# Patient Record
Sex: Male | Born: 1955 | Race: White | Hispanic: No | Marital: Single | Smoking: Former smoker
Health system: Southern US, Community
[De-identification: ages and names within clinical notes are randomized; demographics above are authoritative.]

## PROBLEM LIST (undated history)

## (undated) DIAGNOSIS — I2510 Atherosclerotic heart disease of native coronary artery without angina pectoris: Secondary | ICD-10-CM

## (undated) DIAGNOSIS — I10 Essential (primary) hypertension: Secondary | ICD-10-CM

## (undated) DIAGNOSIS — E785 Hyperlipidemia, unspecified: Secondary | ICD-10-CM

## (undated) HISTORY — DX: Hyperlipidemia, unspecified: E78.5

## (undated) HISTORY — DX: Atherosclerotic heart disease of native coronary artery without angina pectoris: I25.10

## (undated) HISTORY — DX: Essential (primary) hypertension: I10

## (undated) HISTORY — PX: CARDIAC SURGERY: SHX584

---

## 2003-04-23 ENCOUNTER — Emergency Department (HOSPITAL_COMMUNITY): Admission: EM | Admit: 2003-04-23 | Discharge: 2003-04-23 | Payer: Self-pay | Admitting: Emergency Medicine

## 2006-10-01 ENCOUNTER — Encounter: Admission: RE | Admit: 2006-10-01 | Discharge: 2006-10-01 | Payer: Self-pay | Admitting: Family Medicine

## 2006-12-24 ENCOUNTER — Encounter: Admission: RE | Admit: 2006-12-24 | Discharge: 2006-12-24 | Payer: Self-pay | Admitting: Gastroenterology

## 2007-02-11 ENCOUNTER — Ambulatory Visit (HOSPITAL_COMMUNITY): Admission: RE | Admit: 2007-02-11 | Discharge: 2007-02-11 | Payer: Self-pay | Admitting: Gastroenterology

## 2007-02-11 ENCOUNTER — Encounter (INDEPENDENT_AMBULATORY_CARE_PROVIDER_SITE_OTHER): Payer: Self-pay | Admitting: Interventional Radiology

## 2009-10-14 ENCOUNTER — Emergency Department (HOSPITAL_COMMUNITY): Admission: EM | Admit: 2009-10-14 | Discharge: 2009-10-15 | Payer: Self-pay | Admitting: Emergency Medicine

## 2009-10-17 ENCOUNTER — Observation Stay (HOSPITAL_COMMUNITY): Admission: RE | Admit: 2009-10-17 | Discharge: 2009-10-18 | Payer: Self-pay | Admitting: Orthopedic Surgery

## 2010-06-16 LAB — DIFFERENTIAL
Basophils Absolute: 0 10*3/uL (ref 0.0–0.1)
Basophils Relative: 0 % (ref 0–1)
Eosinophils Absolute: 0.1 10*3/uL (ref 0.0–0.7)
Eosinophils Relative: 2 % (ref 0–5)
Lymphocytes Relative: 37 % (ref 12–46)
Lymphs Abs: 2.4 10*3/uL (ref 0.7–4.0)
Monocytes Absolute: 0.5 10*3/uL (ref 0.1–1.0)
Monocytes Relative: 8 % (ref 3–12)
Neutro Abs: 3.4 10*3/uL (ref 1.7–7.7)
Neutrophils Relative %: 53 % (ref 43–77)

## 2010-06-16 LAB — BASIC METABOLIC PANEL
BUN: 10 mg/dL (ref 6–23)
CO2: 27 mEq/L (ref 19–32)
Calcium: 9.5 mg/dL (ref 8.4–10.5)
Chloride: 106 mEq/L (ref 96–112)
Creatinine, Ser: 0.96 mg/dL (ref 0.4–1.5)
GFR calc Af Amer: >60 mL/min (ref >60)
GFR calc non Af Amer: >60 mL/min (ref >60)
Glucose, Bld: 99 mg/dL (ref 70–99)
Potassium: 4.5 mEq/L (ref 3.5–5.1)
Sodium: 139 mEq/L (ref 135–145)

## 2010-06-16 LAB — CBC
HCT: 46.5 % (ref 39.0–52.0)
Hemoglobin: 16.1 g/dL (ref 13.0–17.0)
MCH: 32.9 pg (ref 26.0–34.0)
MCHC: 34.6 g/dL (ref 30.0–36.0)
MCV: 95.1 fL (ref 78.0–100.0)
Platelets: 188 10*3/uL (ref 150–400)
RBC: 4.89 MIL/uL (ref 4.22–5.81)
RDW: 13.5 % (ref 11.5–15.5)
WBC: 6.5 10*3/uL (ref 4.0–10.5)

## 2010-06-16 LAB — SURGICAL PCR SCREEN
MRSA, PCR: NEGATIVE
Staphylococcus aureus: POSITIVE — AB

## 2010-06-16 LAB — PROTIME-INR
INR: 0.87 (ref 0.00–1.49)
Prothrombin Time: 11.8 seconds (ref 11.6–15.2)

## 2010-08-20 ENCOUNTER — Inpatient Hospital Stay (HOSPITAL_COMMUNITY)
Admission: RE | Admit: 2010-08-20 | Discharge: 2010-08-25 | DRG: 234 | Disposition: A | Discharge status: Home / Self Care | Payer: 59 | Source: Ambulatory Visit | Attending: Cardiothoracic Surgery | Admitting: Cardiothoracic Surgery

## 2010-08-20 ENCOUNTER — Ambulatory Visit (HOSPITAL_COMMUNITY): Payer: 59

## 2010-08-20 DIAGNOSIS — K7689 Other specified diseases of liver: Secondary | ICD-10-CM | POA: Diagnosis present

## 2010-08-20 DIAGNOSIS — D6959 Other secondary thrombocytopenia: Secondary | ICD-10-CM | POA: Diagnosis not present

## 2010-08-20 DIAGNOSIS — Z148 Genetic carrier of other disease: Secondary | ICD-10-CM

## 2010-08-20 DIAGNOSIS — E8779 Other fluid overload: Secondary | ICD-10-CM | POA: Diagnosis not present

## 2010-08-20 DIAGNOSIS — I2510 Atherosclerotic heart disease of native coronary artery without angina pectoris: Secondary | ICD-10-CM

## 2010-08-20 DIAGNOSIS — Z01810 Encounter for preprocedural cardiovascular examination: Secondary | ICD-10-CM

## 2010-08-20 DIAGNOSIS — E785 Hyperlipidemia, unspecified: Secondary | ICD-10-CM | POA: Diagnosis present

## 2010-08-20 DIAGNOSIS — I10 Essential (primary) hypertension: Secondary | ICD-10-CM | POA: Diagnosis present

## 2010-08-20 DIAGNOSIS — D62 Acute posthemorrhagic anemia: Secondary | ICD-10-CM | POA: Diagnosis not present

## 2010-08-20 DIAGNOSIS — E669 Obesity, unspecified: Secondary | ICD-10-CM | POA: Diagnosis present

## 2010-08-20 DIAGNOSIS — Z7982 Long term (current) use of aspirin: Secondary | ICD-10-CM

## 2010-08-20 LAB — COMPREHENSIVE METABOLIC PANEL
ALT: 79 U/L — ABNORMAL HIGH (ref 0–53)
AST: 49 U/L — ABNORMAL HIGH (ref 0–37)
Albumin: 3.6 g/dL (ref 3.5–5.2)
Alkaline Phosphatase: 67 U/L (ref 39–117)
BUN: 11 mg/dL (ref 6–23)
CO2: 28 mEq/L (ref 19–32)
Calcium: 9.3 mg/dL (ref 8.4–10.5)
Chloride: 105 mEq/L (ref 96–112)
Creatinine, Ser: 0.94 mg/dL (ref 0.4–1.5)
GFR calc Af Amer: >60 mL/min (ref >60)
GFR calc non Af Amer: >60 mL/min (ref >60)
Glucose, Bld: 174 mg/dL — ABNORMAL HIGH (ref 70–99)
Potassium: 3.6 mEq/L (ref 3.5–5.1)
Sodium: 141 mEq/L (ref 135–145)
Total Bilirubin: 0.4 mg/dL (ref 0.3–1.2)
Total Protein: 6.4 g/dL (ref 6.0–8.3)

## 2010-08-20 LAB — DIFFERENTIAL
Basophils Absolute: 0 10*3/uL (ref 0.0–0.1)
Basophils Relative: 1 % (ref 0–1)
Eosinophils Absolute: 0.1 10*3/uL (ref 0.0–0.7)
Eosinophils Relative: 2 % (ref 0–5)
Lymphocytes Relative: 38 % (ref 12–46)
Lymphs Abs: 2.2 10*3/uL (ref 0.7–4.0)
Monocytes Absolute: 0.4 10*3/uL (ref 0.1–1.0)
Monocytes Relative: 6 % (ref 3–12)
Neutro Abs: 3.1 10*3/uL (ref 1.7–7.7)
Neutrophils Relative %: 54 % (ref 43–77)

## 2010-08-20 LAB — URINALYSIS, ROUTINE W REFLEX MICROSCOPIC
Bilirubin Urine: NEGATIVE
Glucose, UA: 250 mg/dL — AB
Hgb urine dipstick: NEGATIVE
Ketones, ur: NEGATIVE mg/dL
Nitrite: NEGATIVE
Protein, ur: NEGATIVE mg/dL
Specific Gravity, Urine: 1.038 — ABNORMAL HIGH (ref 1.005–1.030)
Urobilinogen, UA: 1 mg/dL (ref 0.0–1.0)
pH: 6.5 (ref 5.0–8.0)

## 2010-08-20 LAB — BLOOD GAS, ARTERIAL
Acid-base deficit: 1.3 mmol/L (ref 0.0–2.0)
Bicarbonate: 22.5 mEq/L (ref 20.0–24.0)
Drawn by: 312761
FIO2: 0.21 %
O2 Saturation: 97.6 %
Patient temperature: 98.6
TCO2: 23.5 mmol/L (ref 0–100)
pCO2 arterial: 35.1 mmHg (ref 35.0–45.0)
pH, Arterial: 7.422 (ref 7.350–7.450)
pO2, Arterial: 92.5 mmHg (ref 80.0–100.0)

## 2010-08-20 LAB — CBC
HCT: 45.3 % (ref 39.0–52.0)
Hemoglobin: 16.1 g/dL (ref 13.0–17.0)
MCH: 32.9 pg (ref 26.0–34.0)
MCHC: 35.5 g/dL (ref 30.0–36.0)
MCV: 92.4 fL (ref 78.0–100.0)
Platelets: 166 10*3/uL (ref 150–400)
RBC: 4.9 MIL/uL (ref 4.22–5.81)
RDW: 12.9 % (ref 11.5–15.5)
WBC: 5.8 10*3/uL (ref 4.0–10.5)

## 2010-08-20 LAB — PROTIME-INR
INR: 0.93 (ref 0.00–1.49)
Prothrombin Time: 12.7 seconds (ref 11.6–15.2)

## 2010-08-20 LAB — TYPE AND SCREEN
ABO/RH(D): A POS
Antibody Screen: NEGATIVE

## 2010-08-20 LAB — SURGICAL PCR SCREEN
MRSA, PCR: NEGATIVE
Staphylococcus aureus: POSITIVE — AB

## 2010-08-20 LAB — APTT: aPTT: 27 seconds (ref 24–37)

## 2010-08-20 LAB — ABO/RH: ABO/RH(D): A POS

## 2010-08-21 ENCOUNTER — Inpatient Hospital Stay (HOSPITAL_COMMUNITY): Payer: 59

## 2010-08-21 DIAGNOSIS — I2510 Atherosclerotic heart disease of native coronary artery without angina pectoris: Secondary | ICD-10-CM

## 2010-08-21 LAB — POCT I-STAT 3, ART BLOOD GAS (G3+)
Acid-base deficit: 2 mmol/L (ref 0.0–2.0)
Acid-base deficit: 3 mmol/L — ABNORMAL HIGH (ref 0.0–2.0)
Acid-base deficit: 3 mmol/L — ABNORMAL HIGH (ref 0.0–2.0)
Bicarbonate: 25.8 mEq/L — ABNORMAL HIGH (ref 20.0–24.0)
Bicarbonate: 23.9 mEq/L (ref 20.0–24.0)
Bicarbonate: 23.3 mEq/L (ref 20.0–24.0)
Bicarbonate: 22.8 mEq/L (ref 20.0–24.0)
O2 Saturation: 100 %
O2 Saturation: 96 %
O2 Saturation: 93 %
O2 Saturation: 92 %
Patient temperature: 36.2
Patient temperature: 36.9
TCO2: 27 mmol/L (ref 0–100)
TCO2: 25 mmol/L (ref 0–100)
TCO2: 25 mmol/L (ref 0–100)
TCO2: 24 mmol/L (ref 0–100)
pCO2 arterial: 46.6 mmHg — ABNORMAL HIGH (ref 35.0–45.0)
pCO2 arterial: 42.4 mmHg (ref 35.0–45.0)
pCO2 arterial: 44.9 mmHg (ref 35.0–45.0)
pCO2 arterial: 40.7 mmHg (ref 35.0–45.0)
pH, Arterial: 7.351 (ref 7.350–7.450)
pH, Arterial: 7.359 (ref 7.350–7.450)
pH, Arterial: 7.318 — ABNORMAL LOW (ref 7.350–7.450)
pH, Arterial: 7.356 (ref 7.350–7.450)
pO2, Arterial: 279 mmHg — ABNORMAL HIGH (ref 80.0–100.0)
pO2, Arterial: 85 mmHg (ref 80.0–100.0)
pO2, Arterial: 70 mmHg — ABNORMAL LOW (ref 80.0–100.0)
pO2, Arterial: 68 mmHg — ABNORMAL LOW (ref 80.0–100.0)

## 2010-08-21 LAB — POCT I-STAT, CHEM 8
BUN: 8 mg/dL (ref 6–23)
Calcium, Ion: 1.02 mmol/L — ABNORMAL LOW (ref 1.12–1.32)
Chloride: 105 mEq/L (ref 96–112)
Creatinine, Ser: 0.8 mg/dL (ref 0.4–1.5)
Glucose, Bld: 146 mg/dL — ABNORMAL HIGH (ref 70–99)
HCT: 32 % — ABNORMAL LOW (ref 39.0–52.0)
Hemoglobin: 10.9 g/dL — ABNORMAL LOW (ref 13.0–17.0)
Potassium: 3.7 mEq/L (ref 3.5–5.1)
Sodium: 138 mEq/L (ref 135–145)
TCO2: 22 mmol/L (ref 0–100)

## 2010-08-21 LAB — CBC
HCT: 48.4 % (ref 39.0–52.0)
HCT: 32.7 % — ABNORMAL LOW (ref 39.0–52.0)
HCT: 33.6 % — ABNORMAL LOW (ref 39.0–52.0)
Hemoglobin: 17.3 g/dL — ABNORMAL HIGH (ref 13.0–17.0)
Hemoglobin: 11.6 g/dL — ABNORMAL LOW (ref 13.0–17.0)
Hemoglobin: 11.8 g/dL — ABNORMAL LOW (ref 13.0–17.0)
MCH: 32.8 pg (ref 26.0–34.0)
MCH: 32.1 pg (ref 26.0–34.0)
MCH: 31.8 pg (ref 26.0–34.0)
MCHC: 35.7 g/dL (ref 30.0–36.0)
MCHC: 35.5 g/dL (ref 30.0–36.0)
MCHC: 35.1 g/dL (ref 30.0–36.0)
MCV: 91.8 fL (ref 78.0–100.0)
MCV: 90.6 fL (ref 78.0–100.0)
MCV: 90.6 fL (ref 78.0–100.0)
Platelets: 197 10*3/uL (ref 150–400)
Platelets: 123 10*3/uL — ABNORMAL LOW (ref 150–400)
Platelets: 119 10*3/uL — ABNORMAL LOW (ref 150–400)
RBC: 5.27 MIL/uL (ref 4.22–5.81)
RBC: 3.61 MIL/uL — ABNORMAL LOW (ref 4.22–5.81)
RBC: 3.71 MIL/uL — ABNORMAL LOW (ref 4.22–5.81)
RDW: 12.8 % (ref 11.5–15.5)
RDW: 12.5 % (ref 11.5–15.5)
RDW: 12.7 % (ref 11.5–15.5)
WBC: 8.1 10*3/uL (ref 4.0–10.5)
WBC: 14.1 10*3/uL — ABNORMAL HIGH (ref 4.0–10.5)
WBC: 14.6 10*3/uL — ABNORMAL HIGH (ref 4.0–10.5)

## 2010-08-21 LAB — POCT I-STAT 4, (NA,K, GLUC, HGB,HCT)
Glucose, Bld: 112 mg/dL — ABNORMAL HIGH (ref 70–99)
Glucose, Bld: 108 mg/dL — ABNORMAL HIGH (ref 70–99)
Glucose, Bld: 97 mg/dL (ref 70–99)
Glucose, Bld: 116 mg/dL — ABNORMAL HIGH (ref 70–99)
Glucose, Bld: 113 mg/dL — ABNORMAL HIGH (ref 70–99)
Glucose, Bld: 110 mg/dL — ABNORMAL HIGH (ref 70–99)
HCT: 45 % (ref 39.0–52.0)
HCT: 41 % (ref 39.0–52.0)
HCT: 30 % — ABNORMAL LOW (ref 39.0–52.0)
HCT: 27 % — ABNORMAL LOW (ref 39.0–52.0)
HCT: 31 % — ABNORMAL LOW (ref 39.0–52.0)
HCT: 32 % — ABNORMAL LOW (ref 39.0–52.0)
Hemoglobin: 15.3 g/dL (ref 13.0–17.0)
Hemoglobin: 13.9 g/dL (ref 13.0–17.0)
Hemoglobin: 10.2 g/dL — ABNORMAL LOW (ref 13.0–17.0)
Hemoglobin: 9.2 g/dL — ABNORMAL LOW (ref 13.0–17.0)
Hemoglobin: 10.5 g/dL — ABNORMAL LOW (ref 13.0–17.0)
Hemoglobin: 10.9 g/dL — ABNORMAL LOW (ref 13.0–17.0)
Potassium: 4.3 mEq/L (ref 3.5–5.1)
Potassium: 4.6 mEq/L (ref 3.5–5.1)
Potassium: 4.6 mEq/L (ref 3.5–5.1)
Potassium: 5.7 mEq/L — ABNORMAL HIGH (ref 3.5–5.1)
Potassium: 4.2 mEq/L (ref 3.5–5.1)
Potassium: 4 mEq/L (ref 3.5–5.1)
Sodium: 140 mEq/L (ref 135–145)
Sodium: 140 mEq/L (ref 135–145)
Sodium: 135 mEq/L (ref 135–145)
Sodium: 136 mEq/L (ref 135–145)
Sodium: 141 mEq/L (ref 135–145)
Sodium: 141 mEq/L (ref 135–145)

## 2010-08-21 LAB — PROTIME-INR
INR: 1.47 (ref 0.00–1.49)
Prothrombin Time: 18 seconds — ABNORMAL HIGH (ref 11.6–15.2)

## 2010-08-21 LAB — HEMOGLOBIN AND HEMATOCRIT, BLOOD
HCT: 27.9 % — ABNORMAL LOW (ref 39.0–52.0)
Hemoglobin: 9.9 g/dL — ABNORMAL LOW (ref 13.0–17.0)

## 2010-08-21 LAB — MAGNESIUM: Magnesium: 2.3 mg/dL (ref 1.5–2.5)

## 2010-08-21 LAB — GLUCOSE, CAPILLARY: Glucose-Capillary: 125 mg/dL — ABNORMAL HIGH (ref 70–99)

## 2010-08-21 LAB — CREATININE, SERUM
Creatinine, Ser: 0.62 mg/dL (ref 0.4–1.5)
GFR calc Af Amer: >60 mL/min (ref >60)
GFR calc non Af Amer: >60 mL/min (ref >60)

## 2010-08-21 LAB — HEMOGLOBIN A1C
Hgb A1c MFr Bld: 5.8 % — ABNORMAL HIGH (ref <5.7)
Mean Plasma Glucose: 120 mg/dL — ABNORMAL HIGH (ref <117)

## 2010-08-21 LAB — APTT: aPTT: 31 seconds (ref 24–37)

## 2010-08-21 LAB — PLATELET COUNT: Platelets: 103 10*3/uL — ABNORMAL LOW (ref 150–400)

## 2010-08-22 ENCOUNTER — Inpatient Hospital Stay (HOSPITAL_COMMUNITY): Payer: 59

## 2010-08-22 DIAGNOSIS — E1165 Type 2 diabetes mellitus with hyperglycemia: Secondary | ICD-10-CM

## 2010-08-22 DIAGNOSIS — IMO0001 Reserved for inherently not codable concepts without codable children: Secondary | ICD-10-CM

## 2010-08-22 LAB — BASIC METABOLIC PANEL
BUN: 11 mg/dL (ref 6–23)
CO2: 24 mEq/L (ref 19–32)
Calcium: 6.8 mg/dL — ABNORMAL LOW (ref 8.4–10.5)
Chloride: 107 mEq/L (ref 96–112)
Creatinine, Ser: 0.69 mg/dL (ref 0.4–1.5)
GFR calc Af Amer: >60 mL/min (ref >60)
GFR calc non Af Amer: >60 mL/min (ref >60)
Glucose, Bld: 145 mg/dL — ABNORMAL HIGH (ref 70–99)
Potassium: 3.9 mEq/L (ref 3.5–5.1)
Sodium: 138 mEq/L (ref 135–145)

## 2010-08-22 LAB — GLUCOSE, CAPILLARY
Glucose-Capillary: 106 mg/dL — ABNORMAL HIGH (ref 70–99)
Glucose-Capillary: 126 mg/dL — ABNORMAL HIGH (ref 70–99)
Glucose-Capillary: 136 mg/dL — ABNORMAL HIGH (ref 70–99)
Glucose-Capillary: 140 mg/dL — ABNORMAL HIGH (ref 70–99)
Glucose-Capillary: 150 mg/dL — ABNORMAL HIGH (ref 70–99)
Glucose-Capillary: 152 mg/dL — ABNORMAL HIGH (ref 70–99)
Glucose-Capillary: 156 mg/dL — ABNORMAL HIGH (ref 70–99)
Glucose-Capillary: 158 mg/dL — ABNORMAL HIGH (ref 70–99)
Glucose-Capillary: 164 mg/dL — ABNORMAL HIGH (ref 70–99)
Glucose-Capillary: 84 mg/dL (ref 70–99)

## 2010-08-22 LAB — CREATININE, SERUM
Creatinine, Ser: 0.97 mg/dL (ref 0.4–1.5)
GFR calc Af Amer: >60 mL/min (ref >60)
GFR calc non Af Amer: >60 mL/min (ref >60)

## 2010-08-22 LAB — CBC
HCT: 31.5 % — ABNORMAL LOW (ref 39.0–52.0)
HCT: 31.4 % — ABNORMAL LOW (ref 39.0–52.0)
Hemoglobin: 11.1 g/dL — ABNORMAL LOW (ref 13.0–17.0)
Hemoglobin: 11.1 g/dL — ABNORMAL LOW (ref 13.0–17.0)
MCH: 31.7 pg (ref 26.0–34.0)
MCH: 32.6 pg (ref 26.0–34.0)
MCHC: 35.2 g/dL (ref 30.0–36.0)
MCHC: 35.4 g/dL (ref 30.0–36.0)
MCV: 90 fL (ref 78.0–100.0)
MCV: 92.4 fL (ref 78.0–100.0)
Platelets: 118 10*3/uL — ABNORMAL LOW (ref 150–400)
Platelets: 105 10*3/uL — ABNORMAL LOW (ref 150–400)
RBC: 3.5 MIL/uL — ABNORMAL LOW (ref 4.22–5.81)
RBC: 3.4 MIL/uL — ABNORMAL LOW (ref 4.22–5.81)
RDW: 12.8 % (ref 11.5–15.5)
RDW: 13 % (ref 11.5–15.5)
WBC: 10.8 10*3/uL — ABNORMAL HIGH (ref 4.0–10.5)
WBC: 11.8 10*3/uL — ABNORMAL HIGH (ref 4.0–10.5)

## 2010-08-22 LAB — PREPARE PLATELET PHERESIS: Unit division: 0

## 2010-08-22 LAB — MAGNESIUM
Magnesium: 2.1 mg/dL (ref 1.5–2.5)
Magnesium: 2.3 mg/dL (ref 1.5–2.5)

## 2010-08-22 NOTE — Cardiovascular Report (Signed)
NAME:  Oscar Rodriguez, Oscar Rodriguez NO.:  [id number]  MEDICAL RECORD NO.:  [id number]           PATIENT TYPE:  O  LOCATION:  2005                         FACILITY:  [facility]  PHYSICIAN:  Jake Bathe, MD      DATE OF BIRTH:  [birth year]  DATE OF PROCEDURE:  08/20/2010 DATE OF DISCHARGE:                           CARDIAC CATHETERIZATION   PROCEDURE:  Radial artery approach, left heart catheterization, selective coronary angiography, left ventriculogram.  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 Nuclear stress test demonstrated an inferior wall perfusion defect, diffuse ST-segment depression during exercise.  PROCEDURE DETAILS:  Informed consent was obtained.  Risk of stroke, heart attack, death, renal impairment, arterial damage, bleeding were discussed with the patient at length.  Lidocaine 1% was used for local anesthesia over the right radial artery distribution.  Using the modified Seldinger technique, a 5-French hydrophilic sheath was inserted to the right radial artery without difficulty.  A Wholey wire was used to traverse the aortic arch with a Judkins right #4 catheter.  After the sheath was inserted, 3 mg of verapamil was administered and 4000 units of heparin was administered.  Multiple views with hand injection of Omnipaque were obtained in the right coronary artery.  This catheter was then exchanged for a Judkins left 3.5 catheter which were used to selectively cannulate the left main artery.  Multiple views with hand injection of Omnipaque were obtained.  This catheter was then exchanged for an angled pigtail which was used to cross into the left ventricle and a left  ventriculogram in the RAO position utilizing 25 mL of contrast was performed.  Following procedure, catheters and sheath was removed.  Terumo T band was used for hemostasis.  Tolerated the procedure very well.  Allen test was normal pre and postprocedure.  FINDINGS: 1. Left main artery - widely patent branches into the LAD and     circumflex artery.  There was no angiographically significant     disease present. 2. LAD - there is significant 95% stenosis in the ostial/proximal LAD     distribution that is mildly calcified.  This is just before the     large first septal branches noted.  There is also three diagonal     branches, the second of which is very small in caliber and has an     80% stenosis.  There are right-to-left collaterals noted supplying     the occluded right coronary artery. 3. Obtuse marginal - there is a significant stenosis in the first     obtuse marginal branch in the proximal portion of 90%.  This obtuse     marginal branch then continues to branch distally.  The remaining     AV groove circ is mild to moderate in caliber and size. 4. Right coronary artery - proximally occluded with no obvious     beaking.  Left-to-right  collateralization noted. 5. Left ventriculogram - normal left ventricular ejection fraction of     60% with no wall motion abnormalities present.  No significant     mitral regurgitation.  Left ventricular systolic pressure 113 with     an end-diastolic pressure of 21 mmHg.  Aortic pressure of 113/89     with a mean of 102 mmHg.  IMPRESSION: 1. Severe three-vessel coronary artery disease, most notably 90%     proximal left anterior descending, 90% first obtuse marginal, and     100% occluded proximal right coronary artery. 2. Normal left ventricular ejection fraction of 60% with no wall     motion abnormalities.  PLAN:  Admit to hospital given his progression of symptoms and consulted Dr. Kathlee Nations Trigt of Cardiothoracic Surgery for bypass.   He was on pravastatin at home for lipids and I will increase the potency of statin with Crestor.  Continue aspirin.  Check fasting lipid profile and hemoglobin A1c.     Jake Bathe, MD     MCS/MEDQ  D:  08/20/2010  T:  08/21/2010  Job:  [id number]  cc:   Bryan Lemma. Manus Gunning, M.D.  Electronically Signed by Donato Schultz MD on 08/22/2010 06:32:29 AM

## 2010-08-23 ENCOUNTER — Inpatient Hospital Stay (HOSPITAL_COMMUNITY): Payer: 59

## 2010-08-23 LAB — GLUCOSE, CAPILLARY
Glucose-Capillary: 122 mg/dL — ABNORMAL HIGH (ref 70–99)
Glucose-Capillary: 141 mg/dL — ABNORMAL HIGH (ref 70–99)

## 2010-08-23 LAB — BASIC METABOLIC PANEL WITH GFR
BUN: 14 mg/dL (ref 6–23)
CO2: 28 meq/L (ref 19–32)
Calcium: 7.8 mg/dL — ABNORMAL LOW (ref 8.4–10.5)
Chloride: 102 meq/L (ref 96–112)
Creatinine, Ser: 0.79 mg/dL (ref 0.4–1.5)
GFR calc non Af Amer: >60 mL/min
Glucose, Bld: 126 mg/dL — ABNORMAL HIGH (ref 70–99)
Potassium: 4.1 meq/L (ref 3.5–5.1)
Sodium: 136 meq/L (ref 135–145)

## 2010-08-23 LAB — CBC
HCT: 30.2 % — ABNORMAL LOW (ref 39.0–52.0)
Hemoglobin: 10.7 g/dL — ABNORMAL LOW (ref 13.0–17.0)
MCH: 32.5 pg (ref 26.0–34.0)
MCHC: 35.4 g/dL (ref 30.0–36.0)
MCV: 91.8 fL (ref 78.0–100.0)
Platelets: 102 K/uL — ABNORMAL LOW (ref 150–400)
RBC: 3.29 MIL/uL — ABNORMAL LOW (ref 4.22–5.81)
RDW: 13 % (ref 11.5–15.5)
WBC: 11.2 K/uL — ABNORMAL HIGH (ref 4.0–10.5)

## 2010-08-23 NOTE — Consult Note (Signed)
Oscar Rodriguez, LOUX NO.:  [id number]  MEDICAL RECORD NO.:  [id number]           PATIENT TYPE:  O  LOCATION:  2005                         FACILITY:  [facility]  PHYSICIAN:  Kerin Perna, M.D.  DATE OF BIRTH:  March 06, 1956  DATE OF CONSULTATION:  08/20/2010 DATE OF DISCHARGE:                                CONSULTATION   REASON FOR CONSULTATION:  Severe multivessel coronary artery disease with unstable angina.  HISTORY OF PRESENT ILLNESS:  I was asked to evaluate this 55 year old Caucasian overweight male for possible multivessel bypass grafting after being diagnosed today with severe multivessel coronary artery disease. The patient was evaluated by Dr. Manus Gunning and referred to Dr. Anne Fu, for a history of exertional chest tightness, back pain, and dyspnea on exertion which has been increasing in frequency, severity, and duration. An abnormal stress test showed inferior wall ischemia and a 2-D echo showed preserved LV function without significant valvular disease.  His risk factors are history of smoking, hypertension, and positive family history of CAD.  Cardiac cath today demonstrated an occluded dominant right coronary with re-collateralization of the posterior descending, 90% stenosis of the LAD involving the takeoff of the diagonal and 90% stenosis of the obtuse marginal.  EF was 60% and LVEDP was 11 mmHg. Based on the patient's coronary anatomy, symptoms, and positive stress test, he is felt to be a candidate for surgical coronary revascularization.  PAST MEDICAL HISTORY: 1. Hypertension. 2. Dyslipidemia. 3. Nonalcoholic steatorrhea of the liver. 4. Carrier for hereditary hemochromatosis.  ALLERGIES:  No known drug allergies.  SURGICAL HISTORY:  Right leg, ankle repair with a plate in July 2011 without complication.  HOME MEDICATIONS: 1. Lisinopril 20 mg daily. 2. Aspirin 81 mg daily. 3. Pravastatin 40 mg daily. 4. Fish oil 2 g p.o.  daily.  SOCIAL HISTORY:  The patient is in customer relations.  He stopped smoking 5 years ago.  He drinks alcohol occasionally.  FAMILY HISTORY:  Positive for coronary artery disease on an uncle who died of MI.  His mother had dementia and his father died of unknown causes.  REVIEW OF SYSTEMS:  CONSTITUTIONAL:  Review is negative fever or weight loss.  ENT:  Review is negative for dental complaints or difficulty swallowing or change in vision.  Thoracic review is negative history of chest trauma, pneumothorax, rib fracture, negative for history of recent upper respiratory infection.  CARDIAC:  Review is positive for coronary artery disease.  Negative for valvular heart disease.  Negative for arrhythmia or symptoms of CHF.  He has progressive increasing angina consistent with unstable angina pattern.  GI:  Review is significant for fatty liver disease confirmed by biopsy.  No blood per rectum.  His colonoscopy showed some polyps but no serious lesion - review is negative DVT claudication or TIA.  His carotid duplex study showed no significant stenosis.  Endocrine review is negative diabetes or thyroid disease.  A hemoglobin A1c is pending.  Neurologic review is negative stroke, seizure, or syncope.  PHYSICAL EXAMINATION:  GENERAL:  The patient is 5 feet 7 inches, weighs 212 pounds, BMI 34.12, blood pressure 128/78, pulse  84, and temperature afebrile. GENERAL APPEARANCE:  A middle aged Caucasian male in step-down room, in no acute distress. HEENT:  Normocephalic.  Pupils are equal. NECK:  Without JVD, mass or bruit. LYMPHATICS:  No palpable supraclavicular or cervical adenopathy.  Breath sounds are clear.  There is no thoracic deformity. CARDIAC:  Regular rhythm.  No S3, gallop, or murmur. ABDOMINAL:  Soft, obese, nontender without pulsatile mass. EXTREMITIES:  No clubbing, cyanosis, or edema.  He has a compression dressing in his right wrist, but no hematoma from the cardiac cath  to the right radial artery.  Peripheral pulses are intact in all extremities and there is no evidence of venous insufficiency of his lower extremities. NEUROLOGIC:  Alert and oriented without focal motor deficit.  LABORATORY DATA:  Coronary arteriogram with Dr. Anne Fu, agree with his impression of severe multivessel disease with occlusion of the right coronary and high-grade stenosis of the LAD and obtuse marginal.  LV function is preserved.  RECOMMENDATIONS:  The patient will benefit from multivessel bypass grafting to the symptoms of angina and reduce risk of MI and increase longevity.  I discussed the procedure in detail with the patient including alternatives and associated risks and he understands and agrees to proceed.  We will schedule surgery for a.m. on May 22.     Kerin Perna, M.D.     PV/MEDQ  D:  08/20/2010  T:  08/20/2010  Job:  [id number]  cc:   Jake Bathe, MD Bryan Lemma. Manus Gunning, M.D.  Electronically Signed by Kerin Perna M.D. on 08/23/2010 09:47:22 AM

## 2010-08-23 NOTE — Op Note (Signed)
Oscar Rodriguez, Oscar Rodriguez NO.:  [id number]  MEDICAL RECORD NO.:  [id number]           PATIENT TYPE:  I  LOCATION:  2307                         FACILITY:  [facility]  PHYSICIAN:  Kerin Perna, M.D.  DATE OF BIRTH:  10-Feb-1956  DATE OF PROCEDURE:  08/21/2010 DATE OF DISCHARGE:                              OPERATIVE REPORT   OPERATION: 1. Coronary artery bypass grafting x4 (left internal mammary artery to     LAD, saphenous vein graft to diagonal, saphenous vein graft to OM1,     saphenous vein graft to posterior descending). 2. Endoscopic harvest of right leg greater saphenous vein.  SURGEON:  Kerin Perna, MD.  ASSISTANT:  Rowe Clack, PA-C.  PREOPERATIVE DIAGNOSIS:  Severe three-vessel coronary artery disease with unstable angina.  POSTOPERATIVE DIAGNOSIS:  Severe three-vessel coronary artery disease with unstable angina.  ANESTHESIA:  General by Dr. Bedelia Person.  INDICATIONS:  The patient is a 55 year old, obese, Caucasian male, nonsmoker, who presents with symptoms of unstable angina, was found by cardiac cath to have severe multivessel disease with chronic occlusion of right coronary and 90% stenosis of the LAD and circumflex systems. His LVEF was fairly well preserved and he is felt to be a candidate for surgical revascularization.  Prior to surgery, I examined the patient carefully in his hospital room and reviewed results of the cardiac cath with the patient.  I discussed indications, benefits, alternatives, and risks of coronary artery bypass grafting for treatment of his severe coronary artery disease.  He understood the risks including the risks of MI, stroke, bleeding, blood transfusion requirement, infection, and death.  He agreed to proceed with surgery under what I felt was an informed consent.  OPERATIVE FINDINGS:  The conduit was good.  Targets were small.  The circumflex and LAD vessels were diffusely diseased.  No blood products were  needed for the operation and hematocrit remained at 30.5.  DESCRIPTION OF PROCEDURE:  The patient was brought to the operating room, placed supine on the operating table.  General anesthesia was induced.  The chest, abdomen, and legs were prepped with Betadine and draped as a sterile field.  A sternal incision was made as the saphenous vein was harvested endoscopically from the right leg.  The left internal mammary artery was harvested as a pedicle graft from its origin at the subclavian vessels.  It was a good vessel with excellent flow.  Heparin was administered and a sternal retractor was placed.  The deep blades were needed due to the patient's obese body habitus.  The pericardium was opened and suspended and pursestrings were placed in the ascending aorta and right atrium.  After the ACT was documented as being therapeutic, the patient was cannulated.  After the vein was harvested and found to be adequate, the patient was placed on cardiopulmonary bypass.  The coronaries were identified for grafting and the mammary artery and vein grafts were prepared for the distal anastomoses. Cardioplegia catheter was placed for both antegrade and retrograde cold blood cardioplegia.  The patient was cooled to 32 degrees and the aortic crossclamp was applied.  One liter  of cold blood cardioplegia was delivered in split doses between the antegrade aortic and retrograde coronary sinus catheters.  There is good cardioplegic arrest and septal temperature dropped less than 12 degrees.  Cardioplegia was delivered every 20 minutes or less while the crossclamp was in place.  The distal coronary anastomoses were then performed.  The first distal anastomosis was to the posterior descending branch of right coronary. This was totally occluded proximally.  There is a good target with minimal disease at the point of entry.  A reverse saphenous vein was sewn end-to-side with running 7-0 Prolene with good flow  through the graft.  The second distal anastomosis was to the OM1 branch of the circumflex.  It had some diffuse disease and had a proximal 90% stenosis.  A reverse saphenous vein was sewn end-to-side with running 7- 0 Prolene with excellent flow through the graft.  Cardioplegia was redosed.  The third distal anastomosis was to the first diagonal branch to the LAD.  This had a proximal 90% stenosis.  A reverse saphenous vein was sewn end-to-side with running 7-0 Prolene with good flow through the graft.  Cardioplegia was redosed.  The fourth distal anastomosis was to the distal LAD.  It had diffuse disease and was a 1.5-mm vessel.  The left IMA pedicle was brought through an opening created in the left lateral pericardium, was brought down onto the LAD and sewn end-to-side with running 8-0 Prolene.  There is excellent flow through the anastomosis after briefly releasing the pedicle bulldog on the mammary artery.  The bulldog was reapplied and the pedicle secured to the epicardium.  Cardioplegia was redosed.  While the crossclamp was still in place, three proximal vein anastomoses were performed on the ascending aorta using a 4.0-mm punch and running 7- 0 Prolene.  Prior to tying down final proximal anastomosis, air was vented from the coronaries with a dose of retrograde warm blood cardioplegia.  The crossclamp was then removed.  The heart resumed a spontaneous rhythm and did not require cardioversion.  The grafts were opened and each had good flow. Hemostasis was documented at the proximal distal sites.  The cardioplegia catheters were removed.  Temporary pacing wires were applied.  The patient was rewarmed to 37 degrees and lungs were re- expanded and the ventilator was resumed.  The patient was weaned off bypass without inotropes.  Cardiac output blood pressure were normal and protamine was administered without adverse reaction.  The cannula was removed and mediastinum was irrigated  with warm saline.  The leg incision was irrigated and closed in a standard fashion.  The superior pericardial fat was closed over the aorta.  Two mediastinal and a left pleural chest tube were placed and brought through separate incisions. The sternum was closed with interrupted steel wire.  The pectoralis fascia was closed in running #1 Vicryl.  The subcutaneous and skin layers were closed in running Vicryl and sterile dressings were applied. Total bypass time was 116 minutes.     Kerin Perna, M.D.     PV/MEDQ  D:  08/21/2010  T:  08/22/2010  Job:  [id number]  cc:   Jake Bathe, MD  Electronically Signed by Kerin Perna M.D. on 08/23/2010 09:47:35 AM

## 2010-08-24 LAB — CBC
HCT: 31.3 % — ABNORMAL LOW (ref 39.0–52.0)
Hemoglobin: 10.7 g/dL — ABNORMAL LOW (ref 13.0–17.0)
MCH: 31.6 pg (ref 26.0–34.0)
MCHC: 34.2 g/dL (ref 30.0–36.0)
MCV: 92.3 fL (ref 78.0–100.0)
Platelets: 132 10*3/uL — ABNORMAL LOW (ref 150–400)
RBC: 3.39 MIL/uL — ABNORMAL LOW (ref 4.22–5.81)
RDW: 13.3 % (ref 11.5–15.5)
WBC: 10.4 10*3/uL (ref 4.0–10.5)

## 2010-09-04 NOTE — Discharge Summary (Signed)
  NAMEROSTON, GRUNEWALD NO.:  [id number]  MEDICAL RECORD NO.:  [id number]           PATIENT TYPE:  I  LOCATION:  2016                         FACILITY:  [facility]  PHYSICIAN:  Kerin Perna, M.D.  DATE OF BIRTH:  September 16, 1955  DATE OF ADMISSION:  08/20/2010 DATE OF DISCHARGE:                              DISCHARGE SUMMARY   ADDENDUM  On morning rounds of Aug 25, 2010, the patient was noted to be afebrile, sinus tachycardic in the low 100s, blood pressure well controlled.  O2 sats greater than 90% on room air.  The patient's heart rate continues to increase into the 120s with ambulation.  Lopressor was switched from 25 mg b.i.d. to 25 mg q.8 h.  At rest currently, heart rate in the low 100s.  We will monitor.  Otherwise, the patient is progressing well. Incisions are clean, dry, and intact and healing well.  The patient is tentatively ready for discharge to home later today versus a.m. after evaluated by MD.  Please see dictated discharge summary for followup appointments, discharge instructions, and discharge medications with the change in the discharge medications of the Lopressor 25 mg to q.8 h.     Sol Blazing, PA   ______________________________ Kerin Perna, M.D.    KMD/MEDQ  D:  08/25/2010  T:  08/25/2010  Job:  [id number]  cc:   Jake Bathe, MD  Electronically Signed by Cameron Proud PA on 09/04/2010 10:01:48 AM Electronically Signed by Kerin Perna M.D. on 09/04/2010 05:39:38 PM

## 2010-09-07 DIAGNOSIS — Z0279 Encounter for issue of other medical certificate: Secondary | ICD-10-CM

## 2010-09-13 ENCOUNTER — Other Ambulatory Visit: Payer: Self-pay | Admitting: Cardiothoracic Surgery

## 2010-09-13 DIAGNOSIS — I2510 Atherosclerotic heart disease of native coronary artery without angina pectoris: Secondary | ICD-10-CM

## 2010-09-14 ENCOUNTER — Ambulatory Visit (INDEPENDENT_AMBULATORY_CARE_PROVIDER_SITE_OTHER): Payer: Self-pay | Admitting: Cardiothoracic Surgery

## 2010-09-14 ENCOUNTER — Ambulatory Visit: Payer: Self-pay | Admitting: Cardiothoracic Surgery

## 2010-09-14 ENCOUNTER — Ambulatory Visit
Admission: RE | Admit: 2010-09-14 | Discharge: 2010-09-14 | Disposition: A | Discharge status: Home / Self Care | Payer: 59 | Source: Ambulatory Visit | Attending: Cardiothoracic Surgery | Admitting: Cardiothoracic Surgery

## 2010-09-14 DIAGNOSIS — I2510 Atherosclerotic heart disease of native coronary artery without angina pectoris: Secondary | ICD-10-CM

## 2010-09-15 NOTE — Assessment & Plan Note (Signed)
OFFICE VISIT  Oscar Rodriguez, Oscar Rodriguez DOB:  1955/12/01                                        September 14, 2010 CHART #:  [id number]  CURRENT PROBLEMS.: 1. Status post CABG x4 on Aug 21, 2010, for severe three-vessel     disease with unstable angina (left IMA to LAD, vein graft to the     diagonal, vein graft OM1, vein graft to posterior descending). 2. Hypertension. 3. Obesity.  PRESENT ILLNESS:  The patient is a 55 year old Caucasian gentleman who returns for his first postop visit after undergoing multivessel bypass grafting last month for unstable angina.  His postoperative course was remarkable for some prolonged oxygen dependence which was due to atelectasis improved with pulmonary toilet and ambulation and incentive spirometry.  He also had a tachycardia which was treated with t.i.d. dosing of the Lopressor.  He did remain in a sinus rhythm.  He was discharged home approximately 1 week after surgery on aspirin, and short course of Lasix and potassium, metoprolol 25 b.i.d., Zocor 40 mg a day, fish oil, and oxycodone.  He was seen by Dr. Anne Fu who change his Lopressor to 50 mg b.i.d.  He has had no angina and surgical incisions are healing well.  On exam, his blood pressure is 116/80, pulse 80, respirations 18, and saturation 97%.  He appears to be feeling very well.  Breath sounds are clear and equal and the surgical incisions are healing well.  The cardiac rhythm is regular.  PA and lateral chest x-ray shows some mild atelectasis and small effusion of the left base otherwise clear and the sternal wires were all intact.  IMPRESSION AND PLAN:  The patient was told he could resume driving, and he will be ready to start outpatient cardiac rehab at [facility].  He knows not to lift more than 15 pounds until 3 months after surgery.  He will continue his current medications.  He will return here in the next few weeks to monitor his progress and to discuss  return to work as well as to assess the small left pleural effusion.  Kerin Perna, M.D. Electronically Signed  PV/MEDQ  D:  09/14/2010  T:  09/15/2010  Job:  [id number]  cc:   Jake Bathe, MD Bryan Lemma. Manus Gunning, M.D.

## 2010-09-24 ENCOUNTER — Encounter (HOSPITAL_COMMUNITY): Payer: 59 | Attending: Cardiology

## 2010-09-24 DIAGNOSIS — Z951 Presence of aortocoronary bypass graft: Secondary | ICD-10-CM | POA: Insufficient documentation

## 2010-09-24 DIAGNOSIS — Z5189 Encounter for other specified aftercare: Secondary | ICD-10-CM | POA: Insufficient documentation

## 2010-09-24 DIAGNOSIS — Z148 Genetic carrier of other disease: Secondary | ICD-10-CM | POA: Insufficient documentation

## 2010-09-24 DIAGNOSIS — E669 Obesity, unspecified: Secondary | ICD-10-CM | POA: Insufficient documentation

## 2010-09-24 DIAGNOSIS — I10 Essential (primary) hypertension: Secondary | ICD-10-CM | POA: Insufficient documentation

## 2010-09-24 DIAGNOSIS — Z7982 Long term (current) use of aspirin: Secondary | ICD-10-CM | POA: Insufficient documentation

## 2010-09-24 DIAGNOSIS — I2510 Atherosclerotic heart disease of native coronary artery without angina pectoris: Secondary | ICD-10-CM | POA: Insufficient documentation

## 2010-09-24 DIAGNOSIS — K7689 Other specified diseases of liver: Secondary | ICD-10-CM | POA: Insufficient documentation

## 2010-09-24 DIAGNOSIS — E785 Hyperlipidemia, unspecified: Secondary | ICD-10-CM | POA: Insufficient documentation

## 2010-09-24 NOTE — Discharge Summary (Signed)
Oscar Rodriguez, SACHSE NO.:  [id number]  MEDICAL RECORD NO.:  [id number]           PATIENT TYPE:  I  LOCATION:  2016                         FACILITY:  [facility]  PHYSICIAN:  Kerin Perna, M.D.  DATE OF BIRTH:  May 21, 1955  DATE OF ADMISSION:  08/20/2010 DATE OF DISCHARGE:                              DISCHARGE SUMMARY   HISTORY:  The patient is a 55 year old white overweight male who was admitted this hospitalization with unstable angina for cardiac catheterization.  The patient was initially evaluated by Dr. Manus Gunning and referred to Dr. Anne Fu due to exertional chest tightness, back pain and dyspnea on exertion that had been increasing in frequency, severity and duration.  An abnormal stress test showed inferior wall ischemia and a 2- D echocardiogram showed preserved left ventricular function without significant valvular disease.  He has multiple cardiac risk factors including tobacco abuse, hypertension and positive family history of coronary artery disease.  He was admitted this hospitalization for cardiac catheterization.  PAST MEDICAL HISTORY: 1. Hypertension. 2. Dyslipidemia. 3. Nonalcoholic steatorrhea of the liver. 4. 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 5. Positive history of tobacco abuse.  ALLERGIES:  No known drug allergies.  PAST SURGICAL HISTORY:  Right ankle repair with a plate in [id number] with no complications.  HOME MEDICATIONS: 1. Lisinopril 20 mg daily. 2. Aspirin 81 mg daily. 3. Pravastatin 40 mg daily. 4. Fish oil 2 g p.o. daily.  SOCIAL HISTORY:  The patient works in [employer].  He stopped smoking 5 years ago.  He drinks occasional alcohol.  FAMILY HISTORY:  Coronary artery disease in an uncle who died from myocardial infarction and his mother had dementia and his father died of unknown causes.  REVIEW OF SYMPTOMS AND PHYSICAL EXAM:  Please see the history and physical and consultations done at admission.  HOSPITAL  COURSE:  The patient was admitted for elective cardiac catheterization.  He was found to have an occluded dominant right coronary with recollateralization of the posterior descending. Additionally, he had a 90% stenosis of the LAD involving the takeoff of the diagonal and a 90% stenosis of the obtuse marginal.  Ejection fraction was 60% and left ventricular end-diastolic pressure was 11 mmHg.  Due to these findings, cardiac surgical consultation was then obtained with Kerin Perna, MD who evaluated the patient and his studies and agreed with recommendations to proceed with surgical revascularization as his best option.  PROCEDURE:  On Aug 21, 2010, he underwent coronary artery bypass grafting x4.  The following grafts were placed. 1. Left internal mammary artery to the LAD. 2. Saphenous vein graft to the obtuse marginal #1. 3. Saphenous vein graft to the diagonal. 4. A saphenous vein graft to the right coronary artery.  The patient     tolerated the procedure well was taken to the surgical intensive     care unit in stable condition.  POSTOPERATIVE HOSPITAL COURSE:  The patient has overall done quite well. He has remained hemodynamically stable initially requiring low-dose inotropic support which was weaned without difficulty.  Additionally, he was weaned from the ventilator without difficulty using standard protocols.  All  routine lines, monitors and drainage devices have been discontinued in the standard fashion.  Incisions are healing well without evidence of infection.  He does have a mild acute blood loss anemia.  His values have stabilized.  His most recent hemoglobin and hematocrit dated Aug 24, 2010, are 10.7 and 31.3 respectively. Electrolytes, BUN and creatinine are within normal limits.  Oxygen has been weaned and he maintains good saturations on room air.  He has required a mild diuresis for postoperative volume overload and is improving in this regard.  He will require  short term diuretic as an outpatient.  He is tolerating routine cardiac rehabilitation modalities. He does have a mild postoperative thrombocytopenia which is improving overtime.  Most recent platelet count Aug 24, 2010, is 132,000. Overall, the patient's status is felt to be tentatively stable for discharge in the morning of Aug 25 2010, pending morning round reevaluation.  Note, the patient has had some postoperative tachycardia but no significant dysrhythmias.  His beta-blocker has been adjusted to maximize heart rate control.  CURRENT MEDICATIONS:  At the time of this dictation include the following: 1. Aspirin 325 mg enteric-coated tablet once daily. 2. Lasix 40 mg daily for an additional 5 days. 3. Metoprolol 25 mg p.o. b.i.d. 4. Oxycodone 5 mg IR tablet 1-2 every 4-6 hours as needed. 5. Potassium chloride 20 mEq daily for an additional 5 days. 6. Fish oil 1 capsule daily as before. 7. Multivitamin one daily. 8. Pravastatin 40 mg daily at bedtime. 9. Vitamin C over-the-counter 1 tablet q.a.m. 10.Vitamin E oral over-the-counter tablet one q.a.m.  Currently, he     has not been restarted on his lisinopril as his blood pressure has     not tolerated this and it has been discontinued for the time being.  INSTRUCTIONS:  The patient received written instructions regarding medications, activity, diet, wound care and follow-up.  FOLLOWUP:  Dr. Anne Fu on Friday, September 07, 2010, at 2:15.  Additional appointment will be made for the patient to see Dr. Donata Clay in 3 weeks with a chest x-ray.  CONDITION ON DISCHARGE:  Stable and improving.  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  OTHER DIAGNOSES:  Postoperative volume overload, postoperative acute blood loss anemia, postoperative thrombocytopenia, history of hypertension, history of dyslipidemia, history of nonalcoholic steatorrhea of  the liver, history hereditary hemochromatosis carrier status, history of previous tobacco abuse, no known drug allergies, previous right ankle surgical repair with plating.     Rowe Clack, P.A.-C.   ______________________________ Kerin Perna, M.D.    Sherryll Burger  D:  08/24/2010  T:  08/24/2010  Job:  [id number]  cc:   Jake Bathe, MD Bryan Lemma. Manus Gunning, M.D.  Electronically Signed by Gershon Crane P.A.-C. on 09/06/2010 12:18:14 PM Electronically Signed by Kerin Perna M.D. on 09/24/2010 02:47:38 PM

## 2010-09-26 ENCOUNTER — Encounter (HOSPITAL_COMMUNITY): Payer: 59

## 2010-09-28 ENCOUNTER — Encounter (HOSPITAL_COMMUNITY): Payer: 59

## 2010-10-01 ENCOUNTER — Encounter (HOSPITAL_COMMUNITY): Payer: 59 | Attending: Cardiology

## 2010-10-01 DIAGNOSIS — I2510 Atherosclerotic heart disease of native coronary artery without angina pectoris: Secondary | ICD-10-CM | POA: Insufficient documentation

## 2010-10-01 DIAGNOSIS — E785 Hyperlipidemia, unspecified: Secondary | ICD-10-CM | POA: Insufficient documentation

## 2010-10-01 DIAGNOSIS — Z148 Genetic carrier of other disease: Secondary | ICD-10-CM | POA: Insufficient documentation

## 2010-10-01 DIAGNOSIS — Z951 Presence of aortocoronary bypass graft: Secondary | ICD-10-CM | POA: Insufficient documentation

## 2010-10-01 DIAGNOSIS — I10 Essential (primary) hypertension: Secondary | ICD-10-CM | POA: Insufficient documentation

## 2010-10-01 DIAGNOSIS — Z5189 Encounter for other specified aftercare: Secondary | ICD-10-CM | POA: Insufficient documentation

## 2010-10-01 DIAGNOSIS — E669 Obesity, unspecified: Secondary | ICD-10-CM | POA: Insufficient documentation

## 2010-10-01 DIAGNOSIS — Z7982 Long term (current) use of aspirin: Secondary | ICD-10-CM | POA: Insufficient documentation

## 2010-10-01 DIAGNOSIS — K7689 Other specified diseases of liver: Secondary | ICD-10-CM | POA: Insufficient documentation

## 2010-10-03 ENCOUNTER — Encounter (HOSPITAL_COMMUNITY): Payer: 59

## 2010-10-04 ENCOUNTER — Other Ambulatory Visit: Payer: Self-pay | Admitting: Cardiothoracic Surgery

## 2010-10-04 DIAGNOSIS — I2510 Atherosclerotic heart disease of native coronary artery without angina pectoris: Secondary | ICD-10-CM

## 2010-10-05 ENCOUNTER — Ambulatory Visit: Payer: Self-pay | Admitting: Cardiothoracic Surgery

## 2010-10-05 ENCOUNTER — Ambulatory Visit
Admission: RE | Admit: 2010-10-05 | Discharge: 2010-10-05 | Disposition: A | Discharge status: Home / Self Care | Payer: 59 | Source: Ambulatory Visit | Attending: Cardiothoracic Surgery | Admitting: Cardiothoracic Surgery

## 2010-10-05 ENCOUNTER — Encounter (HOSPITAL_COMMUNITY): Payer: 59

## 2010-10-05 ENCOUNTER — Ambulatory Visit (INDEPENDENT_AMBULATORY_CARE_PROVIDER_SITE_OTHER): Payer: Self-pay | Admitting: Cardiothoracic Surgery

## 2010-10-05 DIAGNOSIS — I2510 Atherosclerotic heart disease of native coronary artery without angina pectoris: Secondary | ICD-10-CM

## 2010-10-05 NOTE — Assessment & Plan Note (Signed)
OFFICE VISIT  TORRANCE, STOCKLEY DOB:  March 23, 1956                                        October 05, 2010 CHART #:  [id number]  HISTORY:  The patient is a 55 year old white male who was recently hospitalized and underwent coronary artery bypass grafting x4 on Aug 21, 2010 for severe 3-vessel coronary artery disease with unstable angina. He did quite well postoperatively.  His course postoperatively was routine with no significant difficulties.  He continues to feel as though he is making excellent progress.  He has been seen in Cardiology followup.  They have adjusted his cholesterol lowering medications changing from pravastatin to atorvastatin.  Additionally, he has started cardiac rehab.  He has had no fevers, chills, or other constitutional symptoms.  His pain is well-controlled.  His breathing is comfortable. He denies shortness of breath or angina symptoms.  He does have some occasional chest click, but it is not persistent.  Chest x-ray was obtained on today's date.  It reveals clear lung field without significant findings of congestive failure, infiltrates, or effusions.  PHYSICAL EXAMINATION:  VITAL SIGNS:  Blood pressure 119/83, pulse 83, respirations 18, oxygen saturation is 98% on room air. GENERAL APPEARANCE:  A well-developed adult male in no acute distress. PULMONARY:  Reveals clear lungs bilaterally. CARDIAC:  Regular rate and rhythm.  Normal S1, S2.  No murmurs, gallops, or rubs. ABDOMEN:  Benign. EXTREMITIES:  Reveal trace edema in lower extremities.  Incisions are well-healed without evidence of infection.  ASSESSMENT:  Mr. Mcclune continues to make excellent progress.  We discussed preventative measures for coronary artery disease.  We discussed activity progression.  He does not require further cardiac surgery followup, although of course we will see him p.r.n. and add request for any new associated surgical needs.  Rowe Clack,  P.A.-C.  Sherryll Burger  D:  10/05/2010  T:  10/05/2010  Job:  [id number]  cc:   Kerin Perna, M.D. Jake Bathe, MD

## 2010-10-08 ENCOUNTER — Encounter (HOSPITAL_COMMUNITY): Payer: 59

## 2010-10-10 ENCOUNTER — Encounter (HOSPITAL_COMMUNITY): Payer: 59

## 2010-10-12 ENCOUNTER — Encounter (HOSPITAL_COMMUNITY): Payer: 59

## 2010-10-15 ENCOUNTER — Encounter (HOSPITAL_COMMUNITY): Payer: 59

## 2010-10-17 ENCOUNTER — Encounter (HOSPITAL_COMMUNITY): Payer: 59

## 2010-10-19 ENCOUNTER — Encounter (HOSPITAL_COMMUNITY): Payer: 59

## 2010-10-22 ENCOUNTER — Encounter (HOSPITAL_COMMUNITY): Payer: 59

## 2010-10-24 ENCOUNTER — Encounter (HOSPITAL_COMMUNITY): Payer: 59

## 2010-10-26 ENCOUNTER — Encounter (HOSPITAL_COMMUNITY): Payer: 59

## 2010-10-29 ENCOUNTER — Encounter (HOSPITAL_COMMUNITY): Payer: 59

## 2010-10-31 ENCOUNTER — Encounter (HOSPITAL_COMMUNITY): Payer: 59 | Attending: Cardiology

## 2010-10-31 DIAGNOSIS — I10 Essential (primary) hypertension: Secondary | ICD-10-CM | POA: Insufficient documentation

## 2010-10-31 DIAGNOSIS — Z951 Presence of aortocoronary bypass graft: Secondary | ICD-10-CM | POA: Insufficient documentation

## 2010-10-31 DIAGNOSIS — E669 Obesity, unspecified: Secondary | ICD-10-CM | POA: Insufficient documentation

## 2010-10-31 DIAGNOSIS — Z7982 Long term (current) use of aspirin: Secondary | ICD-10-CM | POA: Insufficient documentation

## 2010-10-31 DIAGNOSIS — Z5189 Encounter for other specified aftercare: Secondary | ICD-10-CM | POA: Insufficient documentation

## 2010-10-31 DIAGNOSIS — E785 Hyperlipidemia, unspecified: Secondary | ICD-10-CM | POA: Insufficient documentation

## 2010-10-31 DIAGNOSIS — Z148 Genetic carrier of other disease: Secondary | ICD-10-CM | POA: Insufficient documentation

## 2010-10-31 DIAGNOSIS — K7689 Other specified diseases of liver: Secondary | ICD-10-CM | POA: Insufficient documentation

## 2010-10-31 DIAGNOSIS — I2510 Atherosclerotic heart disease of native coronary artery without angina pectoris: Secondary | ICD-10-CM | POA: Insufficient documentation

## 2010-11-02 ENCOUNTER — Encounter (HOSPITAL_COMMUNITY): Payer: 59

## 2010-11-05 ENCOUNTER — Encounter (HOSPITAL_COMMUNITY): Payer: 59

## 2010-11-07 ENCOUNTER — Encounter (HOSPITAL_COMMUNITY): Payer: 59

## 2010-11-09 ENCOUNTER — Encounter (HOSPITAL_COMMUNITY): Payer: 59

## 2010-11-12 ENCOUNTER — Encounter (HOSPITAL_COMMUNITY): Payer: 59

## 2010-11-14 ENCOUNTER — Encounter (HOSPITAL_COMMUNITY): Payer: 59

## 2010-11-16 ENCOUNTER — Encounter (HOSPITAL_COMMUNITY): Payer: 59

## 2010-11-19 ENCOUNTER — Encounter (HOSPITAL_COMMUNITY): Payer: 59

## 2010-11-21 ENCOUNTER — Encounter (HOSPITAL_COMMUNITY): Payer: 59

## 2010-11-23 ENCOUNTER — Encounter (HOSPITAL_COMMUNITY): Payer: 59

## 2010-11-26 ENCOUNTER — Encounter (HOSPITAL_COMMUNITY): Payer: 59

## 2010-11-28 ENCOUNTER — Encounter (HOSPITAL_COMMUNITY): Payer: 59

## 2010-11-30 ENCOUNTER — Encounter (HOSPITAL_COMMUNITY): Payer: 59

## 2010-12-03 ENCOUNTER — Encounter (HOSPITAL_COMMUNITY): Payer: 59

## 2010-12-05 ENCOUNTER — Encounter (HOSPITAL_COMMUNITY): Payer: 59 | Attending: Cardiology

## 2010-12-05 DIAGNOSIS — E669 Obesity, unspecified: Secondary | ICD-10-CM | POA: Insufficient documentation

## 2010-12-05 DIAGNOSIS — I10 Essential (primary) hypertension: Secondary | ICD-10-CM | POA: Insufficient documentation

## 2010-12-05 DIAGNOSIS — Z5189 Encounter for other specified aftercare: Secondary | ICD-10-CM | POA: Insufficient documentation

## 2010-12-05 DIAGNOSIS — E785 Hyperlipidemia, unspecified: Secondary | ICD-10-CM | POA: Insufficient documentation

## 2010-12-05 DIAGNOSIS — Z7982 Long term (current) use of aspirin: Secondary | ICD-10-CM | POA: Insufficient documentation

## 2010-12-05 DIAGNOSIS — Z148 Genetic carrier of other disease: Secondary | ICD-10-CM | POA: Insufficient documentation

## 2010-12-05 DIAGNOSIS — K7689 Other specified diseases of liver: Secondary | ICD-10-CM | POA: Insufficient documentation

## 2010-12-05 DIAGNOSIS — Z951 Presence of aortocoronary bypass graft: Secondary | ICD-10-CM | POA: Insufficient documentation

## 2010-12-05 DIAGNOSIS — I2510 Atherosclerotic heart disease of native coronary artery without angina pectoris: Secondary | ICD-10-CM | POA: Insufficient documentation

## 2010-12-07 ENCOUNTER — Encounter (HOSPITAL_COMMUNITY): Payer: 59

## 2010-12-10 ENCOUNTER — Encounter (HOSPITAL_COMMUNITY): Payer: 59

## 2010-12-12 ENCOUNTER — Encounter (HOSPITAL_COMMUNITY): Payer: 59

## 2010-12-14 ENCOUNTER — Encounter (HOSPITAL_COMMUNITY): Payer: 59

## 2010-12-17 ENCOUNTER — Encounter (HOSPITAL_COMMUNITY): Payer: 59

## 2010-12-19 ENCOUNTER — Encounter (HOSPITAL_COMMUNITY): Payer: 59

## 2010-12-21 ENCOUNTER — Encounter (HOSPITAL_COMMUNITY): Payer: 59

## 2010-12-24 ENCOUNTER — Encounter (HOSPITAL_COMMUNITY): Payer: 59

## 2010-12-26 ENCOUNTER — Encounter (HOSPITAL_COMMUNITY): Payer: 59

## 2010-12-28 ENCOUNTER — Encounter (HOSPITAL_COMMUNITY): Payer: 59

## 2011-01-08 LAB — CBC
HCT: 49.4
Hemoglobin: 16.8
MCHC: 34.1
MCV: 93.4
Platelets: 243
RBC: 5.28
RDW: 12.9
WBC: 6.4

## 2011-01-08 LAB — PROTIME-INR
INR: 0.9
Prothrombin Time: 12.2

## 2012-06-05 IMAGING — CR DG CHEST 2V
2 series · 2 of 2 positions shown · non-contrast
Comparison: Chest radiograph performed 10/17/2009

CLINICAL DATA: Preoperative chest radiograph for CABG; history of
hypertension.

CHEST - 2 VIEW

[w chest pa]
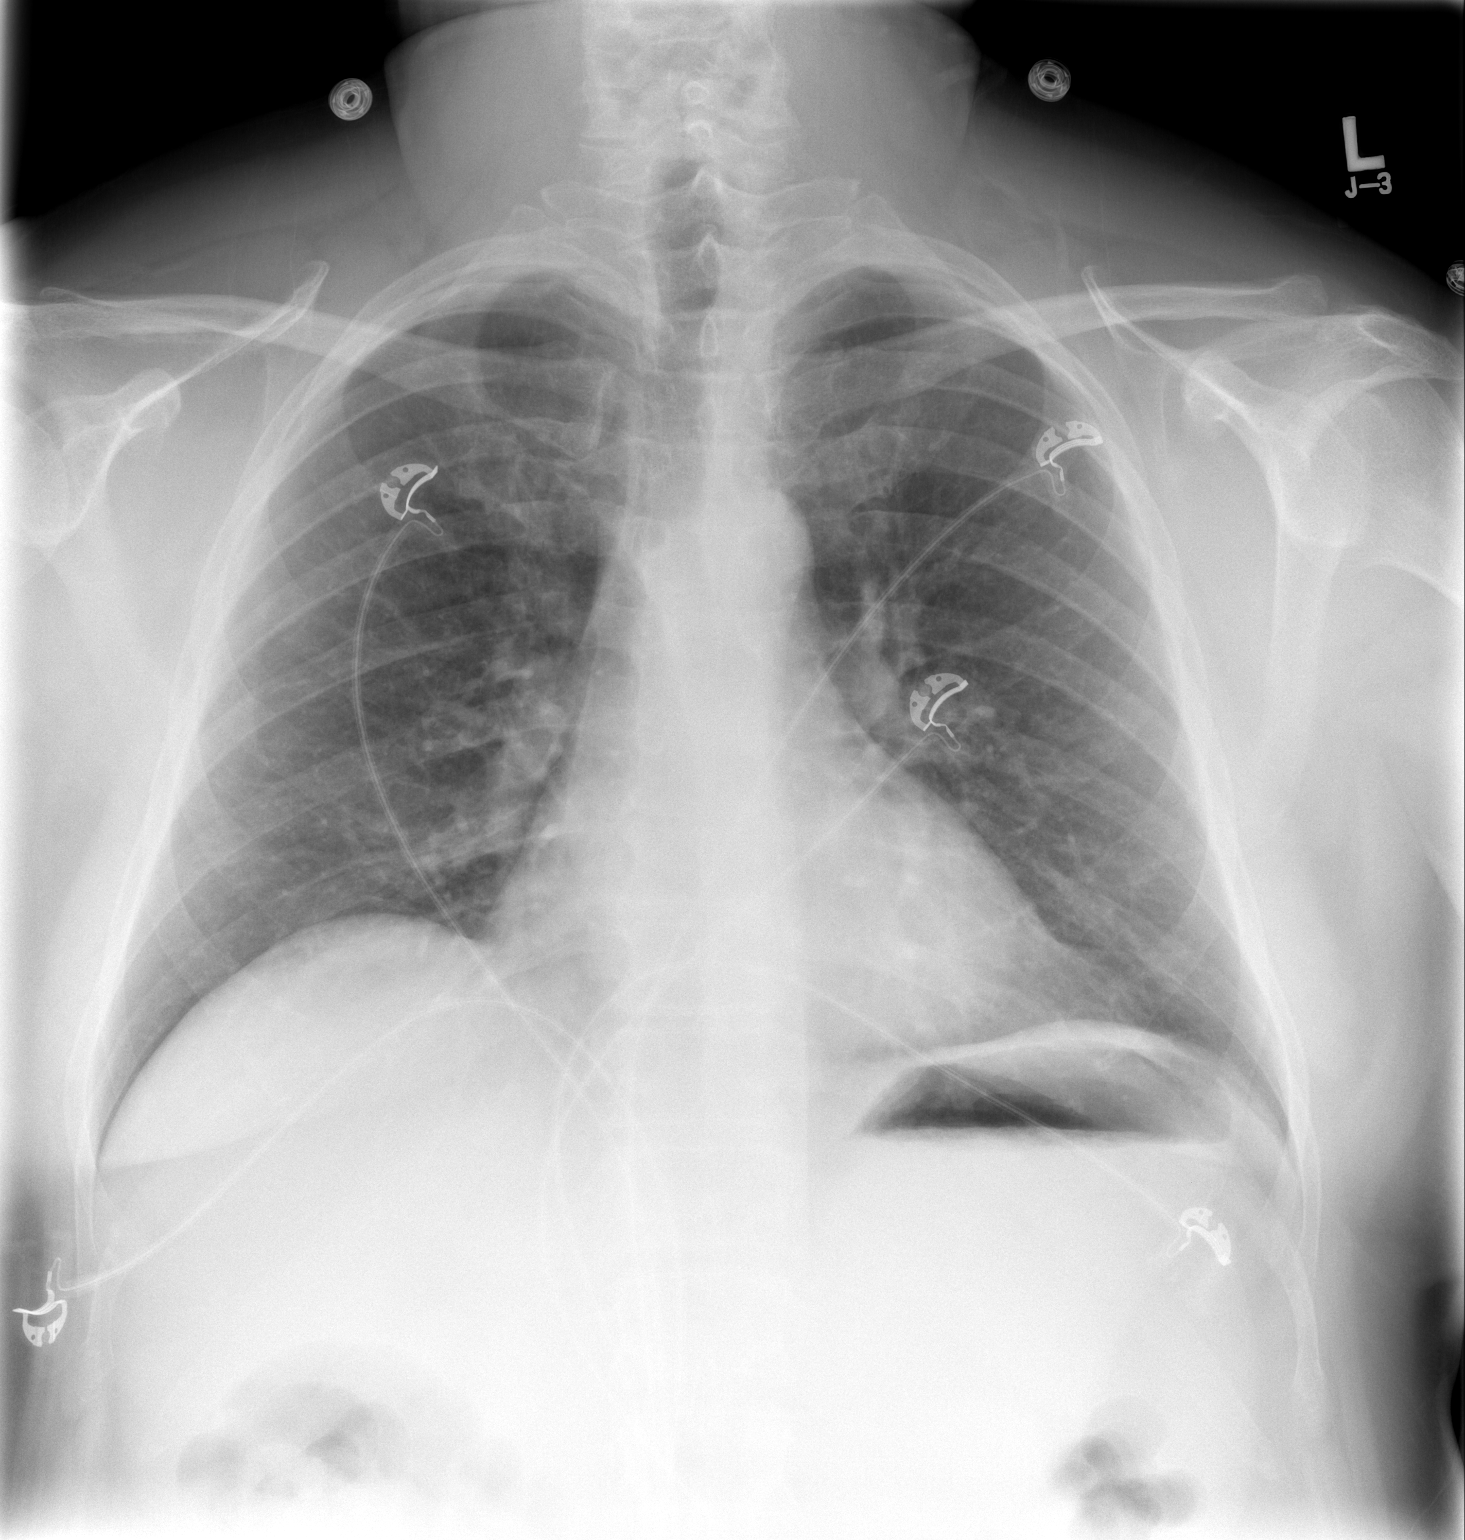

[w chest lat]
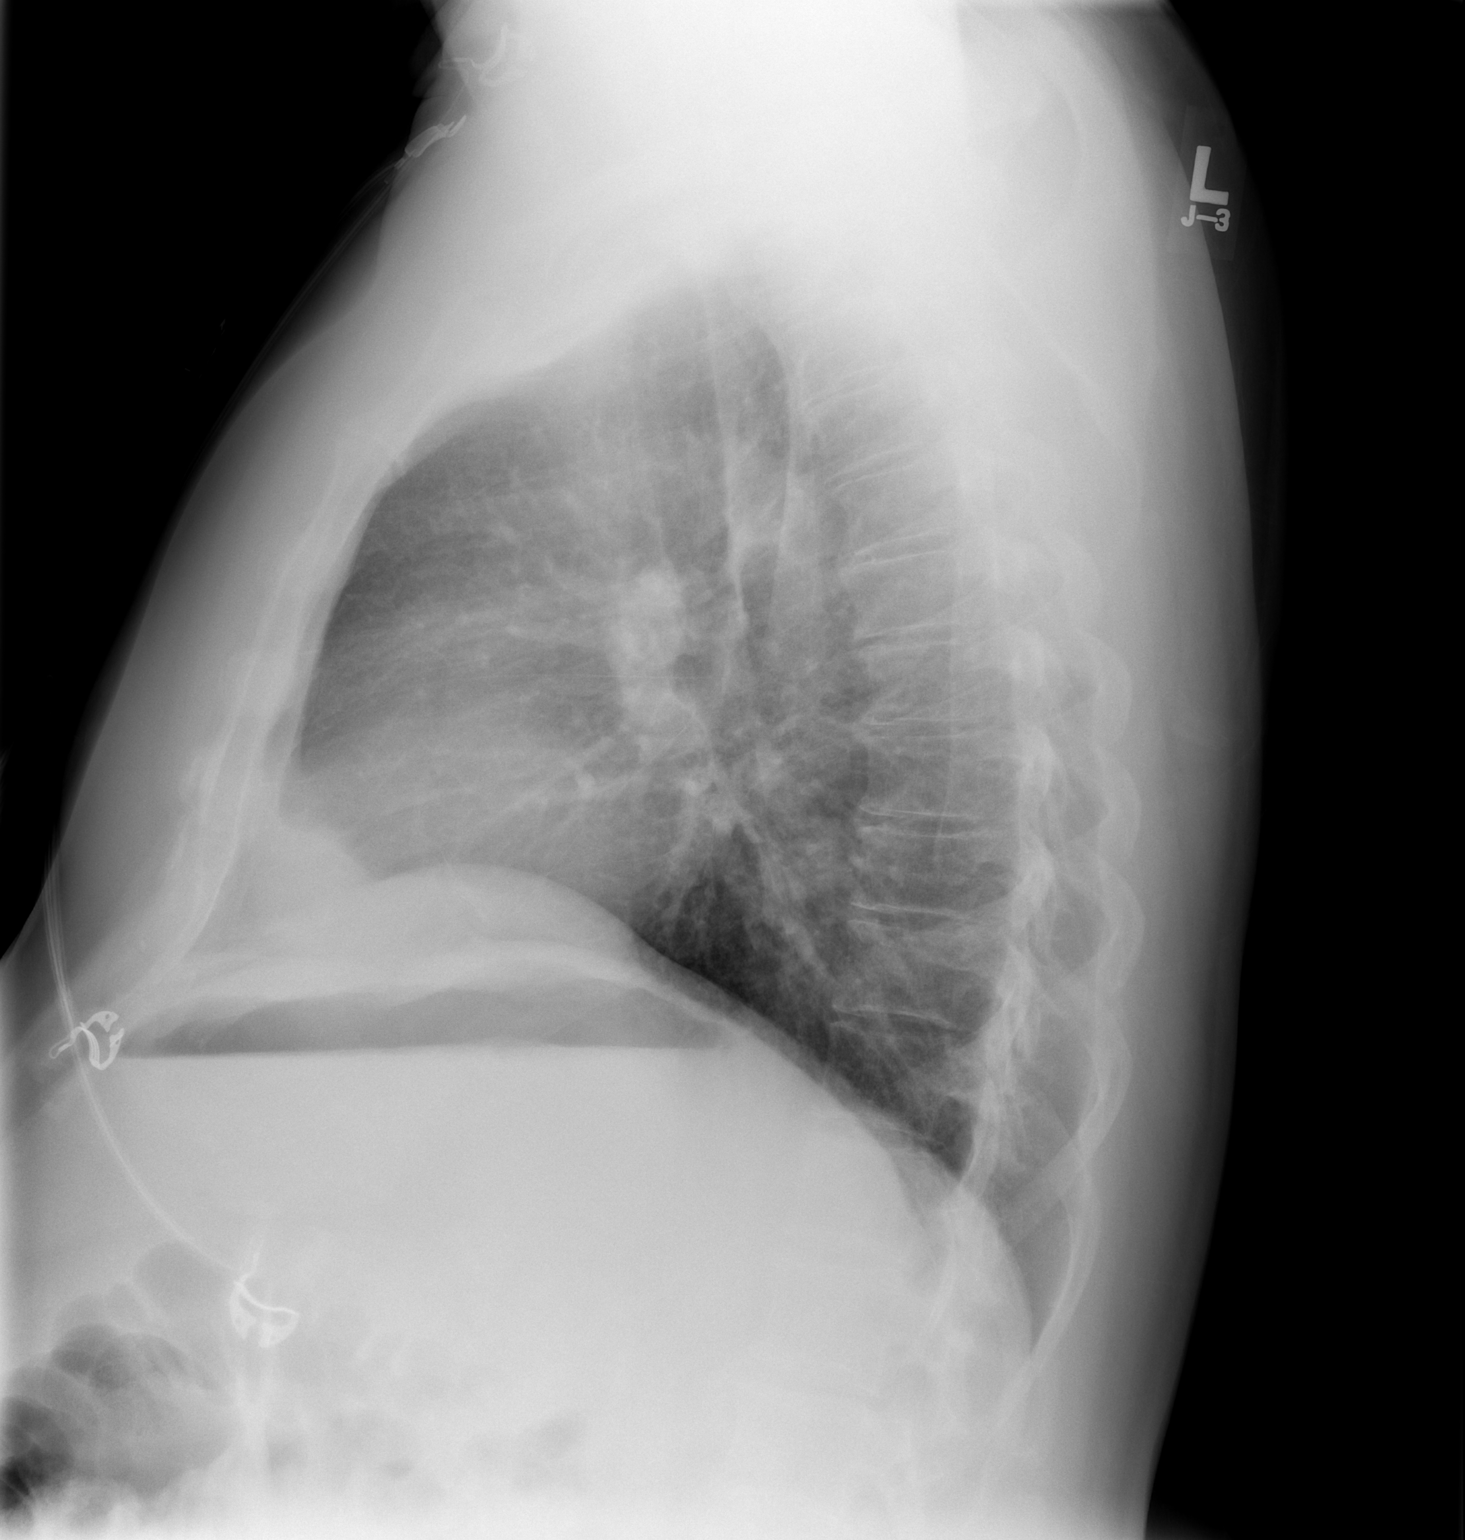

[2 of 2 positions shown; findings below may reference images not displayed]

FINDINGS: The lungs are well-aerated and clear.  There is no
evidence of focal opacification, pleural effusion or pneumothorax.

The heart is normal in size; the mediastinal contour is within
normal limits. Pulmonary vascularity is at the upper limits of
normal.  No acute osseous abnormalities are seen.
IMPRESSION: No acute cardiopulmonary process seen.

## 2012-06-08 IMAGING — CR DG CHEST 2V
2 series · 2 of 2 positions shown · non-contrast
Comparison: 08/22/2010

CLINICAL DATA: CABG, chest tube, shortness of breath.

CHEST - 2 VIEW

[w chest pa]
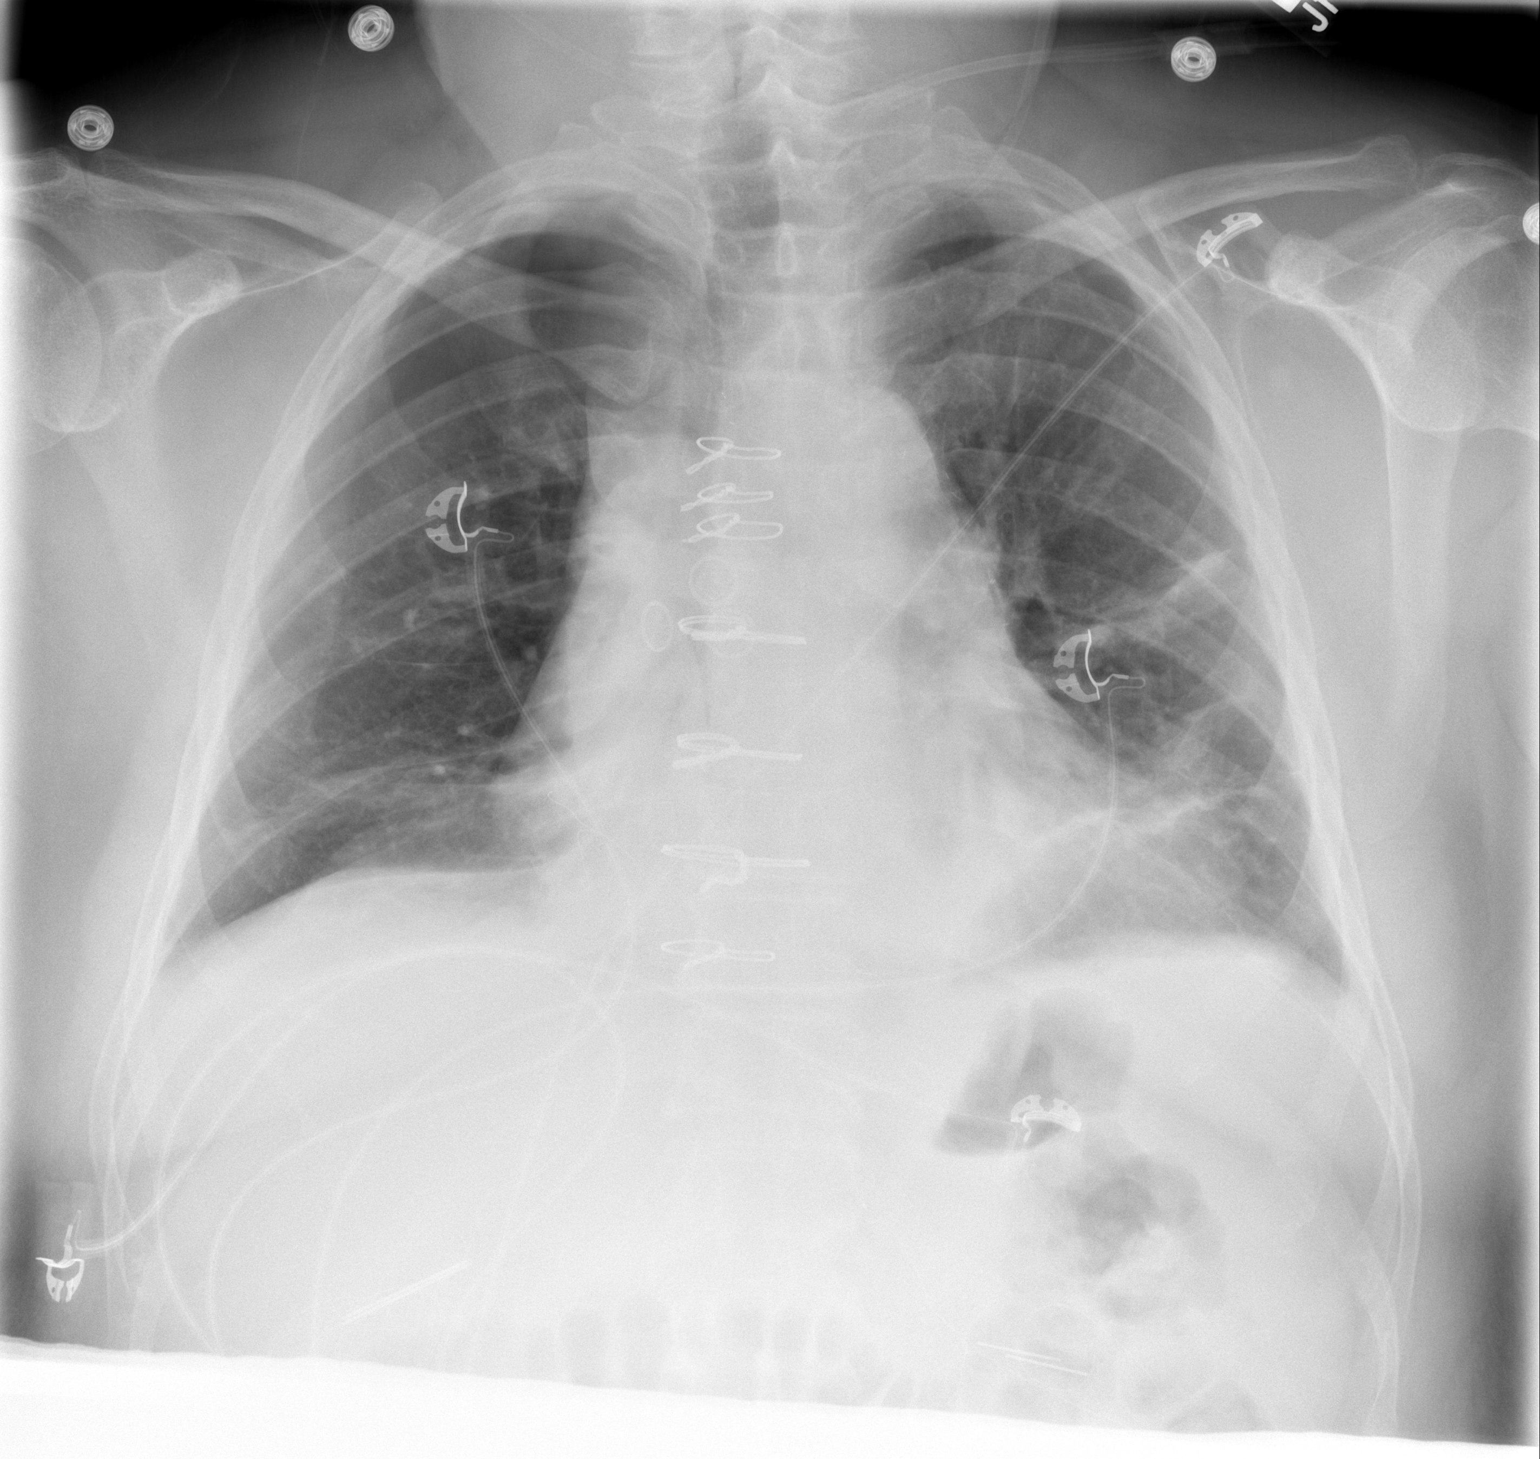

[w chest lat]
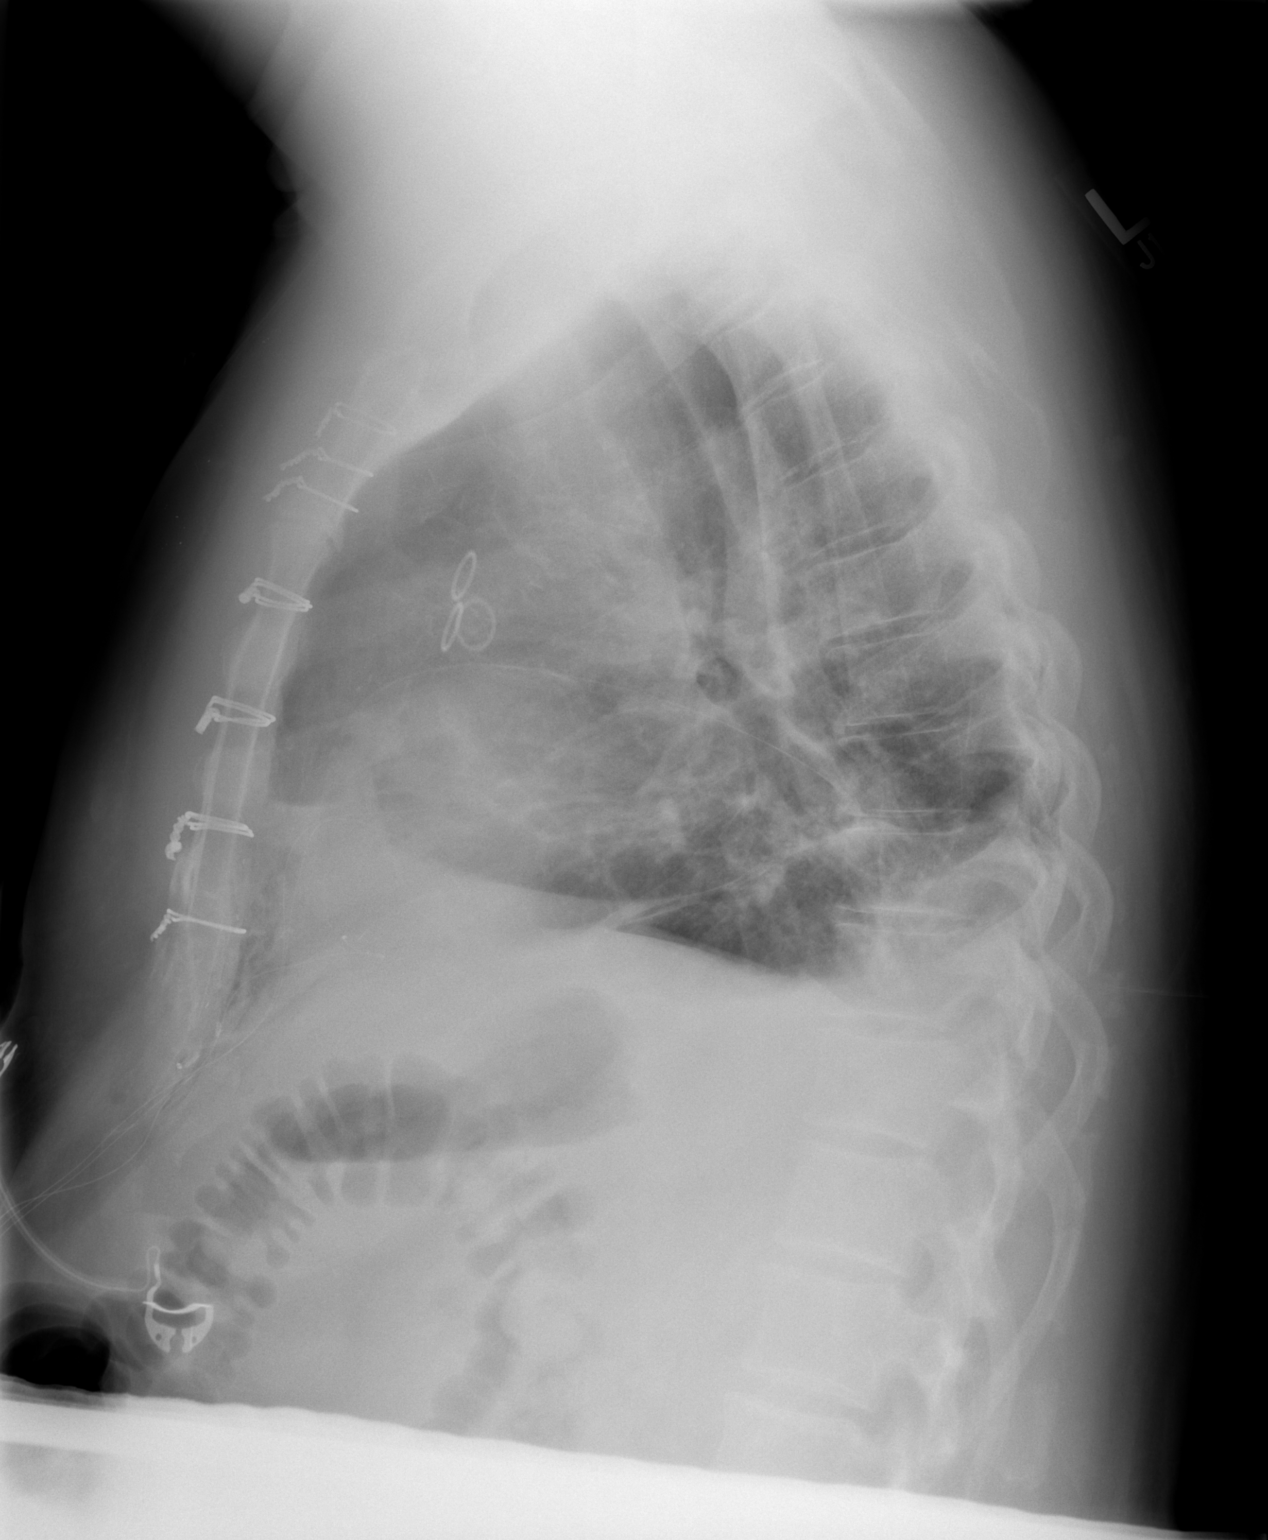

[2 of 2 positions shown; findings below may reference images not displayed]

FINDINGS: Interval removal of left chest tube.  No definite
pneumothorax.  There are low lung volumes with bibasilar
atelectasis.  The Swan-Ganz catheter has been removed.  Changes of
CABG noted.  Mild cardiomegaly.
IMPRESSION: Interval removal of left chest tube without visible pneumothorax.

Low lung volumes with bibasilar atelectasis.

## 2013-06-02 ENCOUNTER — Other Ambulatory Visit: Payer: Self-pay | Admitting: Cardiology

## 2013-06-28 ENCOUNTER — Other Ambulatory Visit: Payer: 59

## 2013-07-01 ENCOUNTER — Other Ambulatory Visit: Payer: Self-pay | Admitting: *Deleted

## 2013-07-01 DIAGNOSIS — E7800 Pure hypercholesterolemia, unspecified: Secondary | ICD-10-CM

## 2013-07-01 DIAGNOSIS — Z79899 Other long term (current) drug therapy: Secondary | ICD-10-CM

## 2013-07-05 ENCOUNTER — Other Ambulatory Visit (INDEPENDENT_AMBULATORY_CARE_PROVIDER_SITE_OTHER): Payer: 59

## 2013-07-05 DIAGNOSIS — E7800 Pure hypercholesterolemia, unspecified: Secondary | ICD-10-CM

## 2013-07-05 DIAGNOSIS — Z79899 Other long term (current) drug therapy: Secondary | ICD-10-CM

## 2013-07-05 LAB — HEPATIC FUNCTION PANEL
ALT: 43 U/L (ref 0–53)
AST: 35 U/L (ref 0–37)
Albumin: 3.9 g/dL (ref 3.5–5.2)
Alkaline Phosphatase: 44 U/L (ref 39–117)
Bilirubin, Direct: 0 mg/dL (ref 0.0–0.3)
Total Bilirubin: 0.5 mg/dL (ref 0.3–1.2)
Total Protein: 6.8 g/dL (ref 6.0–8.3)

## 2013-07-05 LAB — LIPID PANEL
Cholesterol: 120 mg/dL (ref 0–200)
HDL: 41.4 mg/dL (ref >39.00)
LDL Cholesterol: 64 mg/dL (ref 0–99)
Total CHOL/HDL Ratio: 3
Triglycerides: 72 mg/dL (ref 0.0–149.0)
VLDL: 14.4 mg/dL (ref 0.0–40.0)

## 2013-07-07 ENCOUNTER — Other Ambulatory Visit: Payer: 59

## 2013-07-16 ENCOUNTER — Ambulatory Visit (INDEPENDENT_AMBULATORY_CARE_PROVIDER_SITE_OTHER): Payer: 59 | Admitting: Cardiology

## 2013-07-16 ENCOUNTER — Encounter: Payer: Self-pay | Admitting: Cardiology

## 2013-07-16 VITALS — BP 114/80 | HR 70 | Ht 66.0 in | Wt 191.8 lb

## 2013-07-16 DIAGNOSIS — I2510 Atherosclerotic heart disease of native coronary artery without angina pectoris: Secondary | ICD-10-CM

## 2013-07-16 DIAGNOSIS — E7800 Pure hypercholesterolemia, unspecified: Secondary | ICD-10-CM | POA: Insufficient documentation

## 2013-07-16 DIAGNOSIS — I25709 Atherosclerosis of coronary artery bypass graft(s), unspecified, with unspecified angina pectoris: Secondary | ICD-10-CM | POA: Insufficient documentation

## 2013-07-16 DIAGNOSIS — E669 Obesity, unspecified: Secondary | ICD-10-CM

## 2013-07-16 DIAGNOSIS — I10 Essential (primary) hypertension: Secondary | ICD-10-CM | POA: Insufficient documentation

## 2013-07-16 NOTE — Patient Instructions (Addendum)
Your physician recommends that you continue on your current medications as directed. Please refer to the Current Medication list given to you today.  Your physician wants you to follow-up in: 1 year with Dr. Skains. You will receive a reminder letter in the mail two months in advance. If you don't receive a letter, please call our office to schedule the follow-up appointment.  

## 2013-07-16 NOTE — Progress Notes (Signed)
[address] [place], [place]  [address] Phone: [phone] Fax:  [phone]  Date:  07/16/2013   ID:  Francie MassingCharles W Minichiello, DOB 01-05-1956, MRN [id number]  PCP:  No primary provider on file.   History of Present Illness: Lysbeth PennerCharles W Crean is a 58 y.o. male with coronary artery disease status post bypass 08/21/10.   He is doing very well. He has finished cardiac rehabilitation. He is join the [employer]. He has lost from 211-179pounds. Exercising well. Very motivated. His triglycerides have decreased from the 250 down to 90. He takes Nordic fish oil. 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 [employer]. Brisk walking.   His LDL cholesterol is less than 70. Excellent.   Occasionally he may feel some mild chest twinge but nothing severe or significant. He is to monitor his exercise closely. We discussed this.  Have stressed at work. I discussed with him perhaps talking with a coach/psychologist to help with this.    Wt Readings from Last 3 Encounters:  07/16/13 191 lb 12.8 oz (87 kg)     No past medical history on file.  No past surgical history on file.  Current Outpatient Prescriptions  Medication Sig Dispense Refill  . Ascorbic Acid (VITAMIN C) 1000 MG tablet Take 2,000 mg by mouth daily.      Marland Kitchen. aspirin 81 MG tablet Take 81 mg by mouth daily.      Marland Kitchen. atorvastatin (LIPITOR) 40 MG tablet Take 40 mg by mouth daily at 6 PM.       . GLUCOSAMINE HCL PO Take by mouth.      . metoprolol succinate (TOPROL-XL) 50 MG 24 hr tablet Take one tablet by mouth one time daily  30 tablet  1  . Multiple Vitamin (MULTIVITAMIN) tablet Take 1 tablet by mouth daily.      . Omega-3 Fatty Acids (FISH OIL) 1000 MG CAPS Take 3,000 mg by mouth daily.      . vitamin E (VITAMIN E) 400 UNIT capsule Take 400 Units by mouth daily.       No current facility-administered medications for this visit.    Allergies:   Not on File  Social History:  The patient  reports that he quit smoking about 10  years ago. He started smoking about 20 years ago. He does not have any smokeless tobacco history on file. works at [id number].  ROS:  Please see the history of present illness.   Denies any syncope, bleeding, orthopnea, PND    PHYSICAL EXAM: VS:  BP 114/80  Pulse 70  Ht 5\' 6"  (1.676 m)  Wt 191 lb 12.8 oz (87 kg)  BMI 30.97 kg/m2 Well nourished, well developed, in no acute distress HEENT: normal Neck: no JVD Cardiac:  normal S1, S2; RRR; no murmur Lungs:  clear to auscultation bilaterally, no wheezing, rhonchi or rales Abd: soft, nontender, no hepatomegaly Ext: no edema Skin: warm and dry Neuro: no focal abnormalities noted  EKG:  07/16/13 to Normal rhythm, 70, subtle nonspecific ST-T wave changes     ASSESSMENT AND PLAN:  1. Coronary artery disease-post bypass. Doing well, no exertional anginal symptoms. Continue with aggressive secondary prevention. Aspirin. 2. Hyperlipidemia-excellent lipids. LDL 64 on 07/05/13. Lab work reviewed with him. Continue with current plan. Medications reviewed. 3. Hypertension-excellent control. 4. Obesity-continue to encourage weight loss, exercise. He seems to keep very well. Encouraged stress reduction at work.  Signed, Donato SchultzMark Anne Boltz, MD [facility]  07/16/2013 10:12 AM

## 2013-08-01 ENCOUNTER — Other Ambulatory Visit: Payer: Self-pay | Admitting: Cardiology

## 2013-11-11 ENCOUNTER — Other Ambulatory Visit: Payer: Self-pay | Admitting: Cardiology

## 2013-12-01 ENCOUNTER — Telehealth: Payer: Self-pay | Admitting: Cardiology

## 2013-12-01 MED ORDER — LOSARTAN POTASSIUM 25 MG PO TABS: 25.0000 mg | ORAL_TABLET | Freq: Every day | ORAL | Status: DC

## 2013-12-01 NOTE — Telephone Encounter (Signed)
Spoke with pt and gave him instructions from Dr.Skains. Will send prescription to [facility] on [address]. Appt made for pt to see Jacolyn Reedy, PA on December 20, 2013 at 8:15.

## 2013-12-01 NOTE — Telephone Encounter (Signed)
Left message on pt's identified voicemail to call back to verify his medications and to let us know how he is feeling.

## 2013-12-01 NOTE — Telephone Encounter (Signed)
Spoke with pt who reports readings as listed below. He started checking on November 11, 2013 because he was feeling poorly. He was brushing teeth that morning and developed weakness in right arm and right leg. He took a nap and felt better. He has not had these symptoms again. He reports blood pressure readings August 13-23 similar to those listed below. He continues to feel tired but is continuing to try to exercise. No chest pain. Yesterday had stressful event at work and blood pressure in the evening was 140/105. Today it is 131/97 and 140/96. Is taking Toprol 50 mg by mouth daily. Will send to Dr. Anne Fu for his recommendations

## 2013-12-01 NOTE — Telephone Encounter (Signed)
°  Patient is concerned about his BP spiking, Below is a schedule that he kept for his readings  11/22/13   120/93 11/23/13   118/91 11/24/13   120/87 11/25/13   131/98 11/26/13   107/88 11/27/13   111/72 11/28/13   126/89 11/29/13   134/95  Please call and advise.

## 2013-12-01 NOTE — Telephone Encounter (Signed)
Let's add low dose losartan  PO QD.  Have him follow up in 2 weeks with Lorin Picket or Rockey Situ, Loraine Leriche, MD

## 2013-12-20 ENCOUNTER — Ambulatory Visit (INDEPENDENT_AMBULATORY_CARE_PROVIDER_SITE_OTHER): Payer: 59 | Admitting: Physician Assistant

## 2013-12-20 ENCOUNTER — Encounter: Payer: Self-pay | Admitting: Physician Assistant

## 2013-12-20 VITALS — BP 112/74 | HR 74 | Ht 66.0 in | Wt 189.0 lb

## 2013-12-20 DIAGNOSIS — I2510 Atherosclerotic heart disease of native coronary artery without angina pectoris: Secondary | ICD-10-CM

## 2013-12-20 DIAGNOSIS — E669 Obesity, unspecified: Secondary | ICD-10-CM

## 2013-12-20 DIAGNOSIS — I10 Essential (primary) hypertension: Secondary | ICD-10-CM

## 2013-12-20 NOTE — Progress Notes (Signed)
HPI: A 58 year old male patient of Dr. Anne Fu history coronary artery disease status post CABG in 2012. He he also has a history of obesity, hypertension and hyperlipidemia. He called in last week complaining of not feeling well and elevated blood pressures. Losartan 25 mg daily was added to his metoprolol.  The day the patient notices blood pressure elevated he had a very stressful situation at work. He thinks he is going to have to was current job because of his stress level. His blood pressures come down nicely on the losartan. He exercises daily walking 30 minutes and works out with a Systems analyst 2-3 days a week. He watches his salt closely but does not cook. Patient has a low dizziness when he first stands up or when he is gardening. He had this on the metoprolol and it has not changed significantly with the addition of losartan. He has no dizziness or presyncope. He knows how to manage it.  No Known Allergies   Current Outpatient Prescriptions  Medication Sig Dispense Refill  . Ascorbic Acid (VITAMIN C) 1000 MG tablet Take 2,000 mg by mouth daily.      Marland Kitchen aspirin 81 MG tablet Take 81 mg by mouth daily.      Marland Kitchen atorvastatin (LIPITOR) 40 MG tablet take 1 tablet by mouth once daily for cholesterol.  30 tablet  5  . GLUCOSAMINE HCL PO Take by mouth.      . losartan (COZAAR) 25 MG tablet Take 1 tablet (25 mg total) by mouth daily.  30 tablet  11  . metoprolol succinate (TOPROL-XL) 50 MG 24 hr tablet Take one tablet by mouth one time daily  30 tablet  4  . Multiple Vitamin (MULTIVITAMIN) tablet Take 1 tablet by mouth daily.      . Omega-3 Fatty Acids (FISH OIL) 1000 MG CAPS Take 3,000 mg by mouth daily.      . vitamin E (VITAMIN E) 400 UNIT capsule Take 400 Units by mouth daily.       No current facility-administered medications for this visit.    History reviewed. No pertinent past medical history.  History reviewed. No pertinent past surgical history.  History reviewed.  No pertinent family history.  History   Social History  . Marital Status: Single    Spouse Name: N/A    Number of Children: N/A  . Years of Education: N/A   Occupational History  . Not on file.   Social History Main Topics  . Smoking status: Former Smoker    Start date: 07/16/1993    Quit date: 07/17/2003  . Smokeless tobacco: Not on file  . Alcohol Use: Not on file  . Drug Use: Not on file  . Sexual Activity: Not on file   Other Topics Concern  . Not on file   Social History Narrative  . No narrative on file    ROS: See history of present illness otherwise negative  BP 112/74  Pulse 74  Ht  (1.676 m)  Wt 189 lb (85.73 kg)  BMI 30.52 kg/m2  PHYSICAL EXAM: Obese, in no acute distress. Neck: No JVD, HJR, Bruit, or thyroid enlargement  Lungs: No tachypnea, clear without wheezing, rales, or rhonchi  Cardiovascular: RRR, PMI not displaced, heart sounds normal, no murmurs, gallops, bruit, thrill, or heave.  Abdomen: BS normal. Soft without organomegaly, masses, lesions or tenderness.  Extremities: without cyanosis, clubbing or edema. Good distal pulses bilateral  SKin: Warm, no lesions or rashes  Musculoskeletal: No deformities  Neuro: no focal signs   Wt Readings from Last 3 Encounters:  12/20/13 189 lb (85.73 kg)  07/16/13 191 lb 12.8 oz (87 kg)     EKG: Normal sinus rhythm, normal EKG

## 2013-12-20 NOTE — Assessment & Plan Note (Signed)
Stable without chest pain 

## 2013-12-20 NOTE — Assessment & Plan Note (Signed)
Exercise and weight loss

## 2013-12-20 NOTE — Patient Instructions (Signed)
Your physician recommends that you continue on your current medications as directed. Please refer to the Current Medication list given to you today.    KEEP FOLLOW UP APPT WITH [facility] IN APRIL   Low-Sodium Eating Plan Sodium raises blood pressure and causes water to be held in the body. Getting less sodium from food will help lower your blood pressure, reduce any swelling, and protect your heart, liver, and kidneys. We get sodium by adding salt (sodium chloride) to food. Most of our sodium comes from canned, boxed, and frozen foods. Restaurant foods, fast foods, and pizza are also very high in sodium. Even if you take medicine to lower your blood pressure or to reduce fluid in your body, getting less sodium from your food is important. WHAT IS MY PLAN? Most people should limit their sodium intake to 2,300 mg a day. Your health care provider recommends that you limit your sodium intake to ___2000mg _______ a day.  WHAT DO I NEED TO KNOW ABOUT THIS EATING PLAN? For the low-sodium eating plan, you will follow these general guidelines:  Choose foods with a % Daily Value for sodium of less than 5% (as listed on the food label).   Use salt-free seasonings or herbs instead of table salt or sea salt.   Check with your health care provider or pharmacist before using salt substitutes.   Eat fresh foods.  Eat more vegetables and fruits.  Limit canned vegetables. If you do use them, rinse them well to decrease the sodium.   Limit cheese to 1 oz (28 g) per day.   Eat lower-sodium products, often labeled as "lower sodium" or "no salt added."  Avoid foods that contain monosodium glutamate (MSG). MSG is sometimes added to [place] food and some canned foods.  Check food labels (Nutrition Facts labels) on foods to learn how much sodium is in one serving.  Eat more home-cooked food and less restaurant, buffet, and fast food.  When eating at a restaurant, ask that your food be prepared with  less salt or none, if possible.  HOW DO I READ FOOD LABELS FOR SODIUM INFORMATION? The Nutrition Facts label lists the amount of sodium in one serving of the food. If you eat more than one serving, you must multiply the listed amount of sodium by the number of servings. Food labels may also identify foods as:  Sodium free--Less than 5 mg in a serving.  Very low sodium--35 mg or less in a serving.  Low sodium--140 mg or less in a serving.  Light in sodium--50% less sodium in a serving. For example, if a food that usually has 300 mg of sodium is changed to become light in sodium, it will have 150 mg of sodium.  Reduced sodium--25% less sodium in a serving. For example, if a food that usually has 400 mg of sodium is changed to reduced sodium, it will have 300 mg of sodium. WHAT FOODS CAN I EAT? Grains Low-sodium cereals, including oats, puffed wheat and rice, and shredded wheat cereals. Low-sodium crackers. Unsalted rice and pasta. Lower-sodium bread.  Vegetables Frozen or fresh vegetables. Low-sodium or reduced-sodium canned vegetables. Low-sodium or reduced-sodium tomato sauce and paste. Low-sodium or reduced-sodium tomato and vegetable juices.  Fruits Fresh, frozen, and canned fruit. Fruit juice.  Meat and Other Protein Products Low-sodium canned tuna and salmon. Fresh or frozen meat, poultry, seafood, and fish. Lamb. Unsalted nuts. Dried beans, peas, and lentils without added salt. Unsalted canned beans. Homemade soups without salt. Eggs.  Dairy  Milk. Soy milk. Ricotta cheese. Low-sodium or reduced-sodium cheeses. Yogurt.  Condiments Fresh and dried herbs and spices. Salt-free seasonings. Onion and garlic powders. Low-sodium varieties of mustard and ketchup. Lemon juice.  Fats and Oils Reduced-sodium salad dressings. Unsalted butter.  Other Unsalted popcorn and pretzels.  The items listed above may not be a complete list of recommended foods or beverages. Contact your  dietitian for more options. WHAT FOODS ARE NOT RECOMMENDED? Grains Instant hot cereals. Bread stuffing, pancake, and biscuit mixes. Croutons. Seasoned rice or pasta mixes. Noodle soup cups. Boxed or frozen macaroni and cheese. Self-rising flour. Regular salted crackers. Vegetables Regular canned vegetables. Regular canned tomato sauce and paste. Regular tomato and vegetable juices. Frozen vegetables in sauces. Salted french fries. Olives. Angie Fava. Relishes. Sauerkraut. Salsa. Meat and Other Protein Products Salted, canned, smoked, spiced, or pickled meats, seafood, or fish. Bacon, ham, sausage, hot dogs, corned beef, chipped beef, and packaged luncheon meats. Salt pork. Jerky. Pickled herring. Anchovies, regular canned tuna, and sardines. Salted nuts. Dairy Processed cheese and cheese spreads. Cheese curds. Blue cheese and cottage cheese. Buttermilk.  Condiments Onion and garlic salt, seasoned salt, table salt, and sea salt. Canned and packaged gravies. Worcestershire sauce. Tartar sauce. Barbecue sauce. Teriyaki sauce. Soy sauce, including reduced sodium. Steak sauce. Fish sauce. Oyster sauce. Cocktail sauce. Horseradish. Regular ketchup and mustard. Meat flavorings and tenderizers. Bouillon cubes. Hot sauce. Tabasco sauce. Marinades. Taco seasonings. Relishes. Fats and Oils Regular salad dressings. Salted butter. Margarine. Ghee. Bacon fat.  Other Potato and tortilla chips. Corn chips and puffs. Salted popcorn and pretzels. Canned or dried soups. Pizza. Frozen entrees and pot pies.  The items listed above may not be a complete list of foods and beverages to avoid. Contact your dietitian for more information. Document Released: 09/07/2001 Document Revised: 03/23/2013 Document Reviewed: 01/20/2013 [facility] Patient Information 2015 [place], [place]. This information is not intended to replace advice given to you by your health care provider. Make sure you discuss any questions you have with  your health care provider.

## 2013-12-20 NOTE — Assessment & Plan Note (Signed)
Blood pressure controlled with the addition of losartan. Discussed 2 g sodium diet. Weight loss. Followup with Dr. Anne Fu in April.

## 2014-01-05 ENCOUNTER — Other Ambulatory Visit: Payer: Self-pay | Admitting: Cardiology

## 2014-02-18 ENCOUNTER — Encounter: Payer: Self-pay | Admitting: Cardiology

## 2014-04-26 ENCOUNTER — Other Ambulatory Visit: Payer: Self-pay | Admitting: Cardiology

## 2014-07-20 ENCOUNTER — Encounter: Payer: Self-pay | Admitting: Cardiology

## 2014-07-20 ENCOUNTER — Ambulatory Visit (INDEPENDENT_AMBULATORY_CARE_PROVIDER_SITE_OTHER): Payer: BLUE CROSS/BLUE SHIELD | Admitting: Cardiology

## 2014-07-20 VITALS — BP 120/78 | HR 74 | Ht 66.0 in | Wt 200.0 lb

## 2014-07-20 DIAGNOSIS — I2510 Atherosclerotic heart disease of native coronary artery without angina pectoris: Secondary | ICD-10-CM

## 2014-07-20 DIAGNOSIS — E669 Obesity, unspecified: Secondary | ICD-10-CM | POA: Diagnosis not present

## 2014-07-20 DIAGNOSIS — E7800 Pure hypercholesterolemia, unspecified: Secondary | ICD-10-CM

## 2014-07-20 DIAGNOSIS — I10 Essential (primary) hypertension: Secondary | ICD-10-CM

## 2014-07-20 NOTE — Progress Notes (Signed)
[address] [place], [place]  [address] Phone: [phone] Fax:  [phone]  Date:  07/20/2014   ID:  Oscar Rodriguez, DOB 04/28/1955, MRN [id number]  PCP:  Thora LanceEHINGER,ROBERT R, MD   History of Present Illness: Oscar Rodriguez is a 59 y.o. male with coronary artery disease status post bypass 08/21/10.   Having some trouble with exertional shortness of breath. 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 He states that he really like strength training.   He is join the [employer]. He has lost from 211-179 pounds. Now back up to 200. Does not like cardio.  His triglycerides have decreased from the 250 down to 90. He takes Nordic fish oil.   His LDL cholesterol is 77 goal 70.    Occasionally he may feel some mild chest twinge but nothing severe or significant. He is to monitor his exercise closely. We discussed this. He is also felt some burning at times with walking. We will continue to monitor. Previous EKG 11/2013 was normal.  Has stress at work.  Also notes some right greater than left trace edema. Post bypass.    Wt Readings from Last 3 Encounters:  07/20/14 200 lb (90.719 kg)  12/20/13 189 lb (85.73 kg)  07/16/13 191 lb 12.8 oz (87 kg)     Past Medical History  Diagnosis Date  . Hypertension   . Hyperlipidemia   . Coronary artery disease     Past Surgical History  Procedure Laterality Date  . Cardiac surgery      by pass    Current Outpatient Prescriptions  Medication Sig Dispense Refill  . Ascorbic Acid (VITAMIN C) 1000 MG tablet Take 2,000 mg by mouth daily.    Marland Kitchen. aspirin 81 MG tablet Take 81 mg by mouth daily.    Marland Kitchen. atorvastatin (LIPITOR) 40 MG tablet TAKE ONE TABLET BY MOUTH ONE TIME DAILY FOR CHOLESTEROL 30 tablet 4  . cetirizine (ZYRTEC) 10 MG tablet Take 10 mg by mouth daily.    . DiphenhydrAMINE HCl, Sleep, 50 MG CAPS Take 50 mg by mouth at bedtime. Pt states the name of the medicine is Up and Up sleep aid    . GLUCOSAMINE HCL PO Take by  mouth.    . losartan (COZAAR) 25 MG tablet Take 1 tablet (25 mg total) by mouth daily. 30 tablet 11  . metoprolol succinate (TOPROL-XL) 50 MG 24 hr tablet TAKE ONE TABLET BY MOUTH ONE TIME DAILY  30 tablet 11  . Multiple Vitamin (MULTIVITAMIN) tablet Take 1 tablet by mouth daily.    . Omega-3 Fatty Acids (FISH OIL) 1000 MG CAPS Take 3,000 mg by mouth daily.    . vitamin E (VITAMIN E) 400 UNIT capsule Take 400 Units by mouth daily.     No current facility-administered medications for this visit.    Allergies:   No Known Allergies  Social History:  The patient  reports that he quit smoking about 11 years ago. He started smoking about 21 years ago. He does not have any smokeless tobacco history on file. works at [employer].  ROS:  Please see the history of present illness.   Positive for leg swelling, back pain, muscle pain, walking problems, snoring, anxiety Denies any syncope, bleeding, orthopnea, PND    PHYSICAL EXAM: VS:  BP 120/78 mmHg  Pulse 74  Ht 5\' 6"  (1.676 m)  Wt 200 lb (90.719 kg)  BMI 32.30 kg/m2  SpO2 94% Well nourished, well developed,  in no acute distress HEENT: normal Neck: no JVD Cardiac:  normal S1, S2; RRR; no murmur Lungs:  clear to auscultation bilaterally, no wheezing, rhonchi or rales Abd: soft, nontender, no hepatomegaly Ext: no edema Skin: warm and dry Neuro: no focal abnormalities noted  EKG:  07/16/13 to Normal rhythm, 70, subtle nonspecific ST-T wave changes     ASSESSMENT AND PLAN:  1. Coronary artery disease-post bypass. Doing well, no exertional anginal symptoms. Continue with aggressive secondary prevention. Aspirin. 2. Hyperlipidemia-excellent lipids. LDL 64 on 07/05/13. 77 on11/15. Lab work reviewed with him. Continue with current plan. Medications reviewed. 3. Hypertension-excellent control. 4. Edema-right greater than left, trivial. I did not notice on exam today. This is likely from vein extraction for bypass. Discussed with him. 5. Obesity-continue  to encourage weight loss, exercise. He seems to keep very well. Encouraged stress reduction at work. Continue with exercise. If chest burning intensifies or becomes more regular, he will let me know.  Signed, Donato Schultz, MD [facility]  07/20/2014 9:27 AM

## 2014-07-20 NOTE — Patient Instructions (Signed)
Medication Instructions:  The current medical regimen is effective;  continue present plan and medications.  Labwork: None  Testing/Procedures: None  Follow-Up: Follow up in 6 months with Dr. Anne FuSkains.  You will receive a letter in the mail 2 months before you are due.  Please call us when you receive this letter to schedule your follow up appointment.  Thank you for choosing [facility]!!

## 2014-09-23 ENCOUNTER — Other Ambulatory Visit: Payer: Self-pay

## 2014-09-23 MED ORDER — ATORVASTATIN CALCIUM 40 MG PO TABS: 40.0000 mg | ORAL_TABLET | Freq: Every day | ORAL | Status: DC

## 2014-11-14 ENCOUNTER — Other Ambulatory Visit: Payer: Self-pay | Admitting: Cardiology

## 2014-12-14 ENCOUNTER — Other Ambulatory Visit: Payer: Self-pay | Admitting: Cardiology

## 2015-02-08 ENCOUNTER — Other Ambulatory Visit: Payer: Self-pay | Admitting: Cardiology

## 2015-03-03 ENCOUNTER — Encounter: Payer: Self-pay | Admitting: Cardiology

## 2015-03-06 ENCOUNTER — Other Ambulatory Visit: Payer: Self-pay | Admitting: Cardiology

## 2015-04-05 ENCOUNTER — Other Ambulatory Visit: Payer: Self-pay | Admitting: *Deleted

## 2015-04-05 MED ORDER — LOSARTAN POTASSIUM 25 MG PO TABS: 25.0000 mg | ORAL_TABLET | Freq: Every day | ORAL | Status: DC

## 2015-04-07 ENCOUNTER — Ambulatory Visit (INDEPENDENT_AMBULATORY_CARE_PROVIDER_SITE_OTHER): Payer: BLUE CROSS/BLUE SHIELD | Admitting: Cardiology

## 2015-04-07 ENCOUNTER — Encounter: Payer: Self-pay | Admitting: Cardiology

## 2015-04-07 VITALS — BP 116/80 | HR 70 | Ht 67.0 in | Wt 208.8 lb

## 2015-04-07 DIAGNOSIS — I10 Essential (primary) hypertension: Secondary | ICD-10-CM | POA: Diagnosis not present

## 2015-04-07 DIAGNOSIS — E7800 Pure hypercholesterolemia, unspecified: Secondary | ICD-10-CM

## 2015-04-07 DIAGNOSIS — I2510 Atherosclerotic heart disease of native coronary artery without angina pectoris: Secondary | ICD-10-CM

## 2015-04-07 MED ORDER — LOSARTAN POTASSIUM 25 MG PO TABS: 25.0000 mg | ORAL_TABLET | Freq: Every day | ORAL | Status: DC

## 2015-04-07 MED ORDER — ATORVASTATIN CALCIUM 40 MG PO TABS: ORAL_TABLET | ORAL | Status: DC

## 2015-04-07 MED ORDER — METOPROLOL SUCCINATE ER 50 MG PO TB24: 50.0000 mg | ORAL_TABLET | Freq: Every day | ORAL | Status: DC

## 2015-04-07 NOTE — Progress Notes (Signed)
[address] [place], [place]  [address] Phone: [phone] Fax:  [phone]  Date:  04/07/2015   ID:  Oscar Rodriguez, Oscar Rodriguez Oct 20, 1955, MRN [id number]  PCP:  Thora Lance, MD   History of Present Illness: Oscar Rodriguez is a 60 y.o. male with coronary artery disease status post bypass 08/21/10.   Feeling well overall. No changes. No syncope, no bleeding, no orthopnea, no PND.  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 He states that he really like strength training.   He is join the [employer]. He has lost from 211-179 pounds. Now back up to 200. Does not like cardio.  His triglycerides have decreased from the 250 down to 90. He takes Nordic fish oil.   His LDL cholesterol is 77 goal 70.    Occasionally he may feel some mild chest twinge but nothing severe or significant. He is to monitor his exercise closely. We discussed this. He is also felt some burning at times with walking. We will continue to monitor.  Has stress at work.  Also notes some right greater than left trace edema. Post bypass.    Wt Readings from Last 3 Encounters:  04/07/15 208 lb 12.8 oz (94.711 kg)  07/20/14 200 lb (90.719 kg)  12/20/13 189 lb (85.73 kg)     Past Medical History  Diagnosis Date  . Hypertension   . Hyperlipidemia   . Coronary artery disease     Past Surgical History  Procedure Laterality Date  . Cardiac surgery      by pass    Current Outpatient Prescriptions  Medication Sig Dispense Refill  . Ascorbic Acid (VITAMIN C) 1000 MG tablet Take 2,000 mg by mouth daily.    Marland Kitchen aspirin 81 MG tablet Take 81 mg by mouth daily.    Marland Kitchen atorvastatin (LIPITOR) 40 MG tablet TAKE 1 TABLET BY MOUTH EVERY DAY AT 6 PM 30 tablet 4  . diphenhydrAMINE (BENADRYL) 12.5 MG chewable tablet Chew 12.5 mg by mouth daily.    Marland Kitchen GLUCOSAMINE HCL PO Take by mouth.    . losartan (COZAAR) 25 MG tablet Take 1 tablet (25 mg total) by mouth daily. 30 tablet 3  . metoprolol succinate  (TOPROL-XL) 50 MG 24 hr tablet TAKE ONE TABLET BY MOUTH ONE TIME DAILY 30 tablet 11  . Multiple Vitamin (MULTIVITAMIN) tablet Take 1 tablet by mouth daily.    . Omega-3 Fatty Acids (FISH OIL) 1000 MG CAPS Take 3,000 mg by mouth daily.    . vitamin E (VITAMIN E) 400 UNIT capsule Take 400 Units by mouth daily.     No current facility-administered medications for this visit.    Allergies:   No Known Allergies  Social History:  The patient  reports that he quit smoking about 11 years ago. He started smoking about 21 years ago. He does not have any smokeless tobacco history on file. works at [employer].  ROS:  Please see the history of present illness.   Positive for leg swelling, back pain, muscle pain, walking problems, snoring, anxiety Denies any syncope, bleeding, orthopnea, PND    PHYSICAL EXAM: VS:  BP 116/80 mmHg  Pulse 70  Ht 5\' 7"  (1.702 m)  Wt 208 lb 12.8 oz (94.711 kg)  BMI 32.69 kg/m2 Well nourished, well developed, in no acute distress HEENT: normal Neck: no JVD Cardiac:  normal S1, S2; RRR; no murmur Lungs:  clear to auscultation bilaterally, no wheezing, rhonchi or rales Abd: soft,  nontender, no hepatomegaly Ext: no edemaRight lower lateral ankle titanium plate Skin: warm and dry Neuro: no focal abnormalities noted  EKG:  07/16/13 to Normal rhythm, 70, subtle nonspecific ST-T wave changes     ASSESSMENT AND PLAN:  1. Coronary artery disease-post bypass. Doing well, no exertional anginal symptoms. Continue with aggressive secondary prevention. Aspirin. 2. Hyperlipidemia-excellent lipids. LDL 64 on 07/05/13. 77 on 11/15. Lab work reviewed with him. Continue with current plan. Medications reviewed. 3. Hypertension-excellent control. 4. Edema-right greater than left, trivial. I did not notice on exam today. This is likely from vein extraction for bypass. Discussed with him. 5. Obesity-continue to encourage weight loss, exercise. He seems to keep very well. Encouraged stress  reduction at work. Continue with exercise. If chest burning intensifies or becomes more regular, he will let me know. 6. One-year follow-up  Signed, Donato SchultzMark Kemani Demarais, MD [facility]  04/07/2015 11:32 AM

## 2015-04-07 NOTE — Patient Instructions (Signed)

## 2015-08-09 ENCOUNTER — Other Ambulatory Visit: Payer: Self-pay | Admitting: Cardiology

## 2016-02-20 ENCOUNTER — Other Ambulatory Visit: Payer: Self-pay | Admitting: Cardiology

## 2016-02-26 ENCOUNTER — Other Ambulatory Visit: Payer: Self-pay | Admitting: Cardiology

## 2016-03-18 ENCOUNTER — Other Ambulatory Visit: Payer: Self-pay | Admitting: Cardiology

## 2016-04-08 ENCOUNTER — Ambulatory Visit (INDEPENDENT_AMBULATORY_CARE_PROVIDER_SITE_OTHER): Payer: BLUE CROSS/BLUE SHIELD | Admitting: Cardiology

## 2016-04-08 ENCOUNTER — Encounter: Payer: Self-pay | Admitting: Cardiology

## 2016-04-08 VITALS — BP 118/82 | HR 81 | Ht 67.0 in | Wt 216.0 lb

## 2016-04-08 DIAGNOSIS — E7800 Pure hypercholesterolemia, unspecified: Secondary | ICD-10-CM | POA: Diagnosis not present

## 2016-04-08 DIAGNOSIS — I2510 Atherosclerotic heart disease of native coronary artery without angina pectoris: Secondary | ICD-10-CM | POA: Diagnosis not present

## 2016-04-08 DIAGNOSIS — I10 Essential (primary) hypertension: Secondary | ICD-10-CM

## 2016-04-08 DIAGNOSIS — E6609 Other obesity due to excess calories: Secondary | ICD-10-CM | POA: Diagnosis not present

## 2016-04-08 NOTE — Patient Instructions (Signed)

## 2016-04-08 NOTE — Progress Notes (Signed)
[address] [place], [place]  [address] Phone: [phone] Fax:  [phone]  Date:  04/08/2016   ID:  Oscar Rodriguez, Oscar Rodriguez 30, 1957, MRN [id number]  PCP:  Oscar Lance, MD   History of Present Illness: Oscar Rodriguez is a 61 y.o. male with coronary artery disease status post CABG 08/21/10.   Feeling well overall. No changes. No syncope, no bleeding, no orthopnea, no PND.  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 He states that he really like strength training.   He is join the [employer]. He has lost from 211-179 pounds. Does not like cardio.  His triglycerides have decreased from the 250 down to 90. He takes Nordic fish oil.   His LDL cholesterol is 77 goal 70.    Occasionally he may feel some mild chest twinge but nothing severe or significant. He is to monitor his exercise closely. We discussed this. He is also felt some burning at times with walking. We will continue to monitor.   Has stress at work.  Also notes some right greater than left trace edema. Post bypass.  Overall been feeling quite well, no significant chest is comfort. Stress at work continues. Prediabetic. Blood pressure medications changed.    Wt Readings from Last 3 Encounters:  04/08/16 216 lb (98 kg)  04/07/15 208 lb 12.8 oz (94.7 kg)  07/20/14 200 lb (90.7 kg)     Past Medical History:  Diagnosis Date  . Coronary artery disease   . Hyperlipidemia   . Hypertension     Past Surgical History:  Procedure Laterality Date  . CARDIAC SURGERY     by pass    Current Outpatient Prescriptions  Medication Sig Dispense Refill  . Ascorbic Acid (VITAMIN C) 1000 MG tablet Take 2,000 mg by mouth daily.    Marland Kitchen aspirin 81 MG tablet Take 81 mg by mouth daily.    Marland Kitchen atorvastatin (LIPITOR) 40 MG tablet TAKE 1 TABLET BY MOUTH EVERY DAY AT 6 PM 30 tablet 11  . diphenhydrAMINE (BENADRYL) 12.5 MG chewable tablet Chew 12.5 mg by mouth daily as needed for allergies or sleep.     Marland Kitchen  GLUCOSAMINE HCL PO Take by mouth.    . losartan (COZAAR) 50 MG tablet Take 50 mg by mouth daily.    . metoprolol succinate (TOPROL-XL) 50 MG 24 hr tablet TAKE 1 TABLET BY MOUTH DAILY WITH OR IMMEDIATELY FOLLOWING A MEAL. 30 tablet 0  . Multiple Vitamin (MULTIVITAMIN) tablet Take 1 tablet by mouth daily.    . Omega-3 Fatty Acids (FISH OIL) 1000 MG CAPS Take 3,000 mg by mouth daily.    . vitamin B-12 (CYANOCOBALAMIN) 100 MCG tablet Take 100 mcg by mouth daily.    . vitamin E (VITAMIN E) 400 UNIT capsule Take 400 Units by mouth daily.     No current facility-administered medications for this visit.     Allergies:   No Known Allergies  Social History:  The patient  reports that he quit smoking about 12 years ago. He started smoking about 22 years ago. He does not have any smokeless tobacco history on file. works at [employer].  ROS:  Please see the history of present illness.   Positive for leg swelling, back pain, muscle pain, walking problems, snoring, anxiety Denies any syncope, bleeding, orthopnea, PND    PHYSICAL EXAM: VS:  BP 118/82   Pulse 81   Ht 5\' 7"  (1.702 m)   Wt 216 lb (98  kg)   BMI 33.83 kg/m  Well nourished, well developed, in no acute distress HEENT: normal Neck: no JVD Cardiac:  normal S1, S2; RRR; no murmur Lungs:  clear to auscultation bilaterally, no wheezing, rhonchi or rales Abd: soft, nontender, no hepatomegaly Ext: no edemaRight lower lateral ankle titanium plate Skin: warm and dry Neuro: no focal abnormalities noted  EKG:  EKG ordered today 04/08/16-sinus rhythm 81 bpm subtle Q waves 3 and aVF personally viewed-prior 07/16/13 to Normal rhythm, 70, subtle nonspecific ST-T wave changes     ASSESSMENT AND PLAN:  1. Coronary artery disease-post bypass. Doing well, no exertional anginal symptoms. Continue with aggressive secondary prevention. Aspirin. 2. Hyperlipidemia-excellent lipids. Previously LDL 64 on 07/05/13. 77 on 11/15. Lab work reviewed with him. Continue with  current plan. Medications reviewed. 3. Hypertension-excellent control today, home readings do show at times higher diastolic blood pressure. Recent increase in losartan to 50 mg. 4. Edema-right greater than left, trivial. I did not notice on exam today. This is likely from vein extraction for bypass. Discussed with him. 5. Obesity-continue to encourage weight loss, exercise. He seems to keep very well. Encouraged stress reduction at work. Continue with exercise. If chest burning intensifies or becomes more regular, he will let me know. 6. One-year follow-up  Signed, Donato SchultzMark Raffaela Ladley, MD [facility]  04/08/2016 8:58 AM

## 2016-04-23 ENCOUNTER — Other Ambulatory Visit: Payer: Self-pay | Admitting: Cardiology

## 2016-04-25 ENCOUNTER — Other Ambulatory Visit: Payer: Self-pay | Admitting: *Deleted

## 2016-04-25 MED ORDER — LOSARTAN POTASSIUM 50 MG PO TABS: 50.0000 mg | ORAL_TABLET | Freq: Every day | ORAL | 11 refills | Status: DC

## 2016-09-04 ENCOUNTER — Ambulatory Visit (INDEPENDENT_AMBULATORY_CARE_PROVIDER_SITE_OTHER): Payer: BLUE CROSS/BLUE SHIELD | Admitting: Orthopedic Surgery

## 2016-09-04 ENCOUNTER — Ambulatory Visit (INDEPENDENT_AMBULATORY_CARE_PROVIDER_SITE_OTHER): Payer: Self-pay

## 2016-09-04 ENCOUNTER — Encounter (INDEPENDENT_AMBULATORY_CARE_PROVIDER_SITE_OTHER): Payer: Self-pay | Admitting: Orthopedic Surgery

## 2016-09-04 DIAGNOSIS — M25511 Pain in right shoulder: Secondary | ICD-10-CM | POA: Diagnosis not present

## 2016-09-06 ENCOUNTER — Encounter (INDEPENDENT_AMBULATORY_CARE_PROVIDER_SITE_OTHER): Payer: Self-pay | Admitting: Orthopedic Surgery

## 2016-09-06 NOTE — Progress Notes (Signed)
Office Visit Note   Patient: Oscar PennerCharles W Saunders           Date of Birth: Apr 24, 1955           MRN: [id number] Visit Date: 09/04/2016 Requested by: Blair HeysEhinger, Robert, MD [address] [place], [place] [address] PCP: Blair HeysEhinger, Robert, MD  Subjective: Chief Complaint  Patient presents with  . 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  Describes constant pain.  The pain will wake him if he rolls on his right side.  He tolerated better but hasn't done too much better.  Denies any decreased range of motion.  Denies any numbness and tingling denies neck pain which is severe.  He does report a small amount of neck pain.  Reaching across is more painful.  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  He denies much in the way of mechanical symptoms in the right shoulder.              ROS: All systems reviewed are negative as they relate to the chief complaint within the history of present illness.  Patient denies  fevers or chills.   Assessment & Plan: Visit Diagnoses:  1. Acute pain of right shoulder     Plan: Impression is right shoulder pain without distinct evidence of rotator cuff tearing with not much in way weakness or course grinding on range of motion.  He does have a small speckled calcifications lateral to the tuberosity.  Plan is for a course of anti-inflammatories with return office visit in 4 weeks.  To consider injection into the subacromial space at that time if his symptoms have not improved.  Rotator cuff strength feels intact.  Follow-Up Instructions: Return in about 4 weeks (around 10/02/2016).   Orders:  Orders Placed This Encounter  Procedures  . XR Shoulder Right   No orders of the defined types were placed in this encounter.     Procedures: No procedures performed   Clinical Data: No additional findings.  Objective: Vital Signs: There were no vitals taken for this  visit.  Physical Exam:   Constitutional: Patient appears well-developed HEENT:  Head: Normocephalic Eyes:EOM are normal Neck: Normal range of motion Cardiovascular: Normal rate Pulmonary/chest: Effort normal Neurologic: Patient is alert Skin: Skin is warm Psychiatric: Patient has normal mood and affect    Ortho Exam: Orthopedic exam demonstrates full active and passive range of motion of the neck.  5 out of 5 grip EPL FPL interosseous wrist flexion and wrist extension biceps triceps and deltoid strength.  He has no restriction of passive range of motion of the right shoulder.  He has negative O'Brien's testing and no acromioclavicular joint tenderness to direct palpation on the right-hand side.  Specialty Comments:  No specialty comments available.  Imaging: No results found.   PMFS History: Patient Active Problem List   Diagnosis Date Noted  . Coronary atherosclerosis of native coronary artery 07/16/2013  . Obesity 07/16/2013  . Pure hypercholesterolemia 07/16/2013  . Essential hypertension, benign 07/16/2013   Past Medical History:  Diagnosis Date  . Coronary artery disease   . Hyperlipidemia   . Hypertension     No family history on file.  Past Surgical History:  Procedure Laterality Date  . CARDIAC SURGERY     by pass   Social History   Occupational History  . Not on file.   Social History  Main Topics  . Smoking status: Former Smoker    Start date: 07/16/1993    Quit date: 07/17/2003  . Smokeless tobacco: Not on file  . Alcohol use Not on file  . Drug use: Unknown  . Sexual activity: Not on file

## 2016-10-16 ENCOUNTER — Encounter (INDEPENDENT_AMBULATORY_CARE_PROVIDER_SITE_OTHER): Payer: Self-pay | Admitting: Orthopedic Surgery

## 2016-10-16 ENCOUNTER — Ambulatory Visit (INDEPENDENT_AMBULATORY_CARE_PROVIDER_SITE_OTHER): Payer: BLUE CROSS/BLUE SHIELD | Admitting: Orthopedic Surgery

## 2016-10-16 DIAGNOSIS — M25511 Pain in right shoulder: Secondary | ICD-10-CM

## 2016-10-16 DIAGNOSIS — G8929 Other chronic pain: Secondary | ICD-10-CM

## 2016-10-19 DIAGNOSIS — G8929 Other chronic pain: Secondary | ICD-10-CM | POA: Diagnosis not present

## 2016-10-19 DIAGNOSIS — M25511 Pain in right shoulder: Secondary | ICD-10-CM | POA: Diagnosis not present

## 2016-10-19 MED ORDER — BUPIVACAINE HCL 0.25 % IJ SOLN
0.6600 mL | INTRAMUSCULAR | Status: AC | PRN
  Administered 2016-10-19: .66 mL via INTRA_ARTICULAR

## 2016-10-19 MED ORDER — LIDOCAINE HCL 1 % IJ SOLN
3.0000 mL | INTRAMUSCULAR | Status: AC | PRN
  Administered 2016-10-19: 3 mL

## 2016-10-19 MED ORDER — TRIAMCINOLONE ACETONIDE 40 MG/ML IJ SUSP
20.0000 mg | INTRAMUSCULAR | Status: AC | PRN
  Administered 2016-10-19: 20 mg via INTRA_ARTICULAR

## 2016-10-19 MED ORDER — BUPIVACAINE HCL 0.5 % IJ SOLN
9.0000 mL | INTRAMUSCULAR | Status: AC | PRN
  Administered 2016-10-19: 9 mL via INTRA_ARTICULAR

## 2016-10-19 MED ORDER — LIDOCAINE HCL 1 % IJ SOLN
5.0000 mL | INTRAMUSCULAR | Status: AC | PRN
  Administered 2016-10-19: 5 mL

## 2016-10-19 MED ORDER — METHYLPREDNISOLONE ACETATE 40 MG/ML IJ SUSP
40.0000 mg | INTRAMUSCULAR | Status: AC | PRN
  Administered 2016-10-19: 40 mg via INTRA_ARTICULAR

## 2016-10-19 NOTE — Progress Notes (Addendum)
Office Visit Note   Patient: Oscar Rodriguez           Date of Birth: [birth year]           MRN: [id number] Visit Date: 10/16/2016 Requested by: Blair HeysEhinger, Robert, MD [address] [place], [place] [address] PCP: Blair HeysEhinger, Robert, MD  Subjective: Chief Complaint  Patient presents with  . Right Shoulder - Pain, Follow-up    HPI: Oscar MostCharles is a 61 year old patient with right shoulder pain.  Doing about the same.  Has subacromial injection in the past.  Pain does not wake him from sleep.  Takes naproxen which helps some.  Localizes the pain to the superior aspect of the shoulder as well as the deltoid region.  Bleeding was things his pain is the worst.  He can sleep on the right-hand side.  The exercises that really aggravated his shoulder were chest presses and pushups.  He is localizing his pain today more to the acromioclavicular joint.              ROS: All systems reviewed are negative as they relate to the chief complaint within the history of present illness.  Patient denies  fevers or chills.   Assessment & Plan: Visit Diagnoses:  1. Chronic right shoulder pain     Plan: Impression is bursitis and acromioclavicular joint arthritis and inflammation.  Rotator cuff strength is pretty reasonable.  Plan is ultrasound-guided injection into the acromioclavicular joint today along with an injection into the subacromial space.  If his symptoms don't improve then we'll need to consider MRI scanning.  I'll see him back after his scan and after he calls me should his symptoms recur.  We will get the scan at that time for recurrent symptoms  Follow-Up Instructions: Return if symptoms worsen or fail to improve.   Orders:  No orders of the defined types were placed in this encounter.  No orders of the defined types were placed in this encounter.     Procedures: Large Joint Inj Date/Time: 10/19/2016 4:26 PM Performed by: Cammy CopaEAN, SCOTT [place] Authorized by: Cammy CopaEAN, SCOTT Stevi [place]    Consent Given by:  Patient Site marked: the procedure site was marked   Timeout: prior to procedure the correct patient, procedure, and site was verified   Indications:  Pain and diagnostic evaluation Location:  Shoulder Site:  R subacromial bursa Prep: patient was prepped and draped in usual sterile fashion   Needle Size:  18 G Needle Length:  1.5 inches Approach:  Posterior Ultrasound Guidance: No   Fluoroscopic Guidance: No   Arthrogram: No   Medications:  5 mL lidocaine 1 %; 9 mL bupivacaine 0.5 %; 40 mg methylPREDNISolone acetate 40 MG/ML Aspiration Attempted: No   Patient tolerance:  Patient tolerated the procedure well with no immediate complications Medium Joint Inj Date/Time: 10/19/2016 4:27 PM Performed by: Cammy CopaEAN, SCOTT Everleigh Colclasure Authorized by: Cammy CopaEAN, SCOTT Brinda Focht   Consent Given by:  Patient Site marked: the procedure site was marked   Timeout: prior to procedure the correct patient, procedure, and site was verified   Indications:  Pain and diagnostic evaluation Location:  Shoulder Site:  R acromioclavicular Prep: patient was prepped and draped in usual sterile fashion   Needle Size:  25 G Needle Length:  1.5 inches Approach:  Superior Ultrasound Guided: Yes   Fluoroscopic Guidance: No   Medications:  3 mL lidocaine 1 %; 20 mg triamcinolone acetonide 40 MG/ML; 0.66 mL bupivacaine 0.25 % Aspiration Attempted: No  Patient tolerance:  Patient tolerated the procedure well with no immediate complications     Clinical Data: No additional findings.  Objective: Vital Signs: There were no vitals taken for this visit.  Physical Exam:   Constitutional: Patient appears well-developed HEENT:  Head: Normocephalic Eyes:EOM are normal Neck: Normal range of motion Cardiovascular: Normal rate Pulmonary/chest: Effort normal Neurologic: Patient is alert Skin: Skin is warm Psychiatric: Patient has normal mood and affect    Ortho Exam: Portable exam demonstrates  full active and passive range of motion of the right left shoulder with good cervical spine range of motion.  Radial pulses intact bilaterally.  No masses lymph adenopathy or skin changes noted in the shoulder region.  Does have acromioclavicular joint tenderness to direct palpation on the right compared to left.  Rotator cuff strength intact to isolated infraspinatus supraspinatus and subscap motion.  He has positive impingement signs on the right negative on the left.  No restriction of passive range of motion on the right-hand side.  Specialty Comments:  No specialty comments available.  Imaging: No results found.   PMFS History: Patient Active Problem List   Diagnosis Date Noted  . Coronary atherosclerosis of native coronary artery 07/16/2013  . Obesity 07/16/2013  . Pure hypercholesterolemia 07/16/2013  . Essential hypertension, benign 07/16/2013   Past Medical History:  Diagnosis Date  . Coronary artery disease   . Hyperlipidemia   . Hypertension     No family history on file.  Past Surgical History:  Procedure Laterality Date  . CARDIAC SURGERY     by pass   Social History   Occupational History  . Not on file.   Social History Main Topics  . Smoking status: Former Smoker    Start date: 07/16/1993    Quit date: 07/17/2003  . Smokeless tobacco: Never Used  . Alcohol use Not on file  . Drug use: Unknown  . Sexual activity: Not on file

## 2016-10-24 ENCOUNTER — Encounter (INDEPENDENT_AMBULATORY_CARE_PROVIDER_SITE_OTHER): Payer: Self-pay | Admitting: Orthopedic Surgery

## 2016-11-04 DIAGNOSIS — Z23 Encounter for immunization: Secondary | ICD-10-CM | POA: Diagnosis not present

## 2016-11-04 DIAGNOSIS — K7581 Nonalcoholic steatohepatitis (NASH): Secondary | ICD-10-CM | POA: Diagnosis not present

## 2016-11-04 DIAGNOSIS — E7800 Pure hypercholesterolemia, unspecified: Secondary | ICD-10-CM | POA: Diagnosis not present

## 2016-11-04 DIAGNOSIS — I10 Essential (primary) hypertension: Secondary | ICD-10-CM | POA: Diagnosis not present

## 2016-11-04 DIAGNOSIS — R7303 Prediabetes: Secondary | ICD-10-CM | POA: Diagnosis not present

## 2017-05-09 DIAGNOSIS — R7303 Prediabetes: Secondary | ICD-10-CM | POA: Diagnosis not present

## 2017-05-09 DIAGNOSIS — K7581 Nonalcoholic steatohepatitis (NASH): Secondary | ICD-10-CM | POA: Diagnosis not present

## 2017-05-09 DIAGNOSIS — E7800 Pure hypercholesterolemia, unspecified: Secondary | ICD-10-CM | POA: Diagnosis not present

## 2017-05-09 DIAGNOSIS — I10 Essential (primary) hypertension: Secondary | ICD-10-CM | POA: Diagnosis not present

## 2017-05-26 ENCOUNTER — Encounter: Payer: Self-pay | Admitting: Cardiology

## 2017-05-26 ENCOUNTER — Ambulatory Visit (INDEPENDENT_AMBULATORY_CARE_PROVIDER_SITE_OTHER): Payer: BLUE CROSS/BLUE SHIELD | Admitting: Cardiology

## 2017-05-26 VITALS — BP 124/86 | HR 85 | Ht 67.0 in | Wt 224.0 lb

## 2017-05-26 DIAGNOSIS — I208 Other forms of angina pectoris: Secondary | ICD-10-CM

## 2017-05-26 DIAGNOSIS — E7800 Pure hypercholesterolemia, unspecified: Secondary | ICD-10-CM

## 2017-05-26 DIAGNOSIS — I10 Essential (primary) hypertension: Secondary | ICD-10-CM | POA: Diagnosis not present

## 2017-05-26 DIAGNOSIS — I2510 Atherosclerotic heart disease of native coronary artery without angina pectoris: Secondary | ICD-10-CM | POA: Diagnosis not present

## 2017-05-26 NOTE — Patient Instructions (Signed)
Medication Instructions:  The current medical regimen is effective;  continue present plan and medications.  Testing/Procedures: Your physician has requested that you have a myoview. For further information please visit [email or web address]. Please follow instruction sheet, as given.  Follow-Up: Follow up in 1 year with Dr. Skains.  You will receive a letter in the mail 2 months before you are due.  Please call us when you receive this letter to schedule your follow up appointment.  If you need a refill on your cardiac medications before your next appointment, please call your pharmacy.  Thank you for choosing [facility]!!     

## 2017-05-26 NOTE — Progress Notes (Signed)
[address] [place], [place]  [address] Phone: [phone] Fax:  [phone]  Date:  05/26/2017   ID:  Francie MassingCharles W Gora, DOB 09-15-1955, MRN [id number]  PCP:  Blair HeysEhinger, Robert, MD   History of Present Illness: Lysbeth PennerCharles W Brenning is a 62 y.o. male with coronary artery disease status post CABG 08/21/10.  Had a lot of dyspnea prior to CABG. Tired with exertion. Here for follow up.  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 He states that he really like strength training.   He is join the [employer]. He has lost from 211-179 pounds. Does not like cardio.  His triglycerides have decreased from the 250 down to 90. He takes Nordic fish oil.   His LDL cholesterol is 77 goal 70.    Occasionally he may feel some mild chest twinge but nothing severe or significant. He is to monitor his exercise closely. We discussed this. He is also felt some burning at times with walking. We will continue to monitor.   Has stress at work.  Also notes some right greater than left trace edema. Post bypass. Stress at work continues. Prediabetic. Blood pressure medications changed.  05/26/17 -overall doing quite well no chest pain, no shortness of breath, no syncope, no bleeding. Sonetimes feels a wash over him. If walking tract stops and rests. Some soreness.   Wt Readings from Last 3 Encounters:  05/26/17 224 lb (101.6 kg)  04/08/16 216 lb (98 kg)  04/07/15 208 lb 12.8 oz (94.7 kg)     Past Medical History:  Diagnosis Date  . Coronary artery disease   . Hyperlipidemia   . Hypertension     Past Surgical History:  Procedure Laterality Date  . CARDIAC SURGERY     by pass    Current Outpatient Medications  Medication Sig Dispense Refill  . Ascorbic Acid (VITAMIN C) 1000 MG tablet Take 2,000 mg by mouth daily.    Marland Kitchen. aspirin 81 MG tablet Take 81 mg by mouth daily.    Marland Kitchen. atorvastatin (LIPITOR) 40 MG tablet TAKE 1 TABLET BY MOUTH EVERY DAY AT 6 PM 30 tablet 11  . cetirizine (ZYRTEC) 10  MG tablet Take 10 mg by mouth daily as needed for allergies.    Marland Kitchen. GLUCOSAMINE HCL PO Take by mouth.    . losartan (COZAAR) 50 MG tablet Take 1 tablet (50 mg total) by mouth daily. 30 tablet 11  . metoprolol succinate (TOPROL-XL) 50 MG 24 hr tablet TAKE 1 TABLET BY MOUTH DAILY WITH OR IMMEDIATELY FOLLOWING A MEAL. 30 tablet 11  . Multiple Vitamin (MULTIVITAMIN) tablet Take 1 tablet by mouth daily.    . Omega-3 Fatty Acids (FISH OIL) 1000 MG CAPS Take 3,000 mg by mouth daily.    . vitamin B-12 (CYANOCOBALAMIN) 100 MCG tablet Take 100 mcg by mouth daily.    . vitamin E (VITAMIN E) 400 UNIT capsule Take 400 Units by mouth daily.     No current facility-administered medications for this visit.     Allergies:   No Known Allergies  Social History:  The patient  reports that he quit smoking about 13 years ago. He started smoking about 23 years ago. he has never used smokeless tobacco. works at [id number].  ROS:  Please see the history of present illness.   All others neg PHYSICAL EXAM: VS:  BP 124/86   Pulse 85   Ht 5\' 7"  (1.702 m)   Wt 224 lb (101.6 kg)  BMI 35.08 kg/m  Well nourished, well developed, in no acute distress  HEENT: normal  Neck: no JVD  Cardiac:  normal S1, S2; RRR; no murmur  Lungs:  clear to auscultation bilaterally, no wheezing, rhonchi or rales  Abd: soft, nontender, no hepatomegaly  Ext: no edema Right lower lateral ankle titanium plate Skin: warm and dry  Neuro: no focal abnormalities noted  EKG:  EKG ordered today 04/08/16-sinus rhythm 81 bpm subtle Q waves 3 and aVF personally viewed-prior 07/16/13 to Normal rhythm, 70, subtle nonspecific ST-T wave changes     ASSESSMENT AND PLAN:  1. Coronary artery disease-post bypass.  Prior to his bypass surgery he was feeling some increasing shortness of breath with exertion.  He is once again having to stop a couple times when working out, walking around the track, walking outside.  These may be his anginal symptoms. since his  bypass took place in 2012, I would like to obtain a nuclear stress test to check for any high risk features.  Hold metoprolol prior to treadmill test. continue with aggressive secondary prevention. Aspirin. 2. Hyperlipidemia-excellent lipids.  Overall doing quite well.  LDL 87, triglycerides 111, hemoglobin A1c 5.9.  No myalgias on medications. 3. Hypertension-continuing to do well with hypertension control, medications reviewed.   4. Edema-right greater than left, trivial.  Some of this is secondary to vein extraction.  Continue to monitor.   5. Morbid Obesity (BMI greater than 35 with 2 comorbidities)-he has had swings in his weight.  See above for details.  Weight is high once again.  This could be contributing significantly to his shortness of breath as well.  Continue to encourage weight loss, diet, exercise.   6. 12 month follow-up or sooner if necessary.  We will await results of testing.  Signed, Donato Schultz, MD [facility]  05/26/2017 11:15 AM

## 2017-05-26 NOTE — Progress Notes (Signed)
e

## 2017-06-03 ENCOUNTER — Telehealth (HOSPITAL_COMMUNITY): Payer: Self-pay | Admitting: *Deleted

## 2017-06-03 NOTE — Telephone Encounter (Signed)
Left message on voicemail per DPR in reference to upcoming appointment scheduled on 06/09/17 at 1000 with detailed instructions given per Myocardial Perfusion Study Information Sheet for the test. LM to arrive 15 minutes early, and that it is imperative to arrive on time for appointment to keep from having the test rescheduled. If you need to cancel or reschedule your appointment, please call the office within 24 hours of your appointment. Failure to do so may result in a cancellation of your appointment, and a $50 no show fee. Phone number given for call back for any questions.

## 2017-06-09 ENCOUNTER — Ambulatory Visit (INDEPENDENT_AMBULATORY_CARE_PROVIDER_SITE_OTHER): Payer: BLUE CROSS/BLUE SHIELD | Admitting: Cardiology

## 2017-06-09 ENCOUNTER — Encounter: Payer: Self-pay | Admitting: Cardiology

## 2017-06-09 ENCOUNTER — Ambulatory Visit (HOSPITAL_COMMUNITY): Payer: BLUE CROSS/BLUE SHIELD | Attending: Cardiovascular Disease

## 2017-06-09 VITALS — BP 120/83 | HR 102 | Ht 67.0 in | Wt 224.0 lb

## 2017-06-09 DIAGNOSIS — I2510 Atherosclerotic heart disease of native coronary artery without angina pectoris: Secondary | ICD-10-CM | POA: Insufficient documentation

## 2017-06-09 DIAGNOSIS — I208 Other forms of angina pectoris: Secondary | ICD-10-CM | POA: Diagnosis not present

## 2017-06-09 DIAGNOSIS — R9439 Abnormal result of other cardiovascular function study: Secondary | ICD-10-CM | POA: Diagnosis not present

## 2017-06-09 DIAGNOSIS — E785 Hyperlipidemia, unspecified: Secondary | ICD-10-CM | POA: Insufficient documentation

## 2017-06-09 DIAGNOSIS — I10 Essential (primary) hypertension: Secondary | ICD-10-CM | POA: Insufficient documentation

## 2017-06-09 DIAGNOSIS — Z01818 Encounter for other preprocedural examination: Secondary | ICD-10-CM | POA: Diagnosis not present

## 2017-06-09 LAB — MYOCARDIAL PERFUSION IMAGING
Estimated workload: 7 METS
Exercise duration (min): 5 min
Exercise duration (sec): 0 s
LV dias vol: 89 mL (ref 62–150)
LV sys vol: 42 mL
MPHR: 160 {beats}/min
Peak HR: 139 {beats}/min
Percent HR: 86 %
RATE: 0.41
Rest HR: 80 {beats}/min
SDS: 11
SRS: 6
SSS: 17
TID: 1.2

## 2017-06-09 MED ORDER — TECHNETIUM TC 99M TETROFOSMIN IV KIT
10.5000 | PACK | Freq: Once | INTRAVENOUS | Status: AC | PRN
  Administered 2017-06-09: 10.5 via INTRAVENOUS
  Filled 2017-06-09: qty 11

## 2017-06-09 MED ORDER — TECHNETIUM TC 99M TETROFOSMIN IV KIT
31.6000 | PACK | Freq: Once | INTRAVENOUS | Status: AC | PRN
  Administered 2017-06-09: 31.6 via INTRAVENOUS
  Filled 2017-06-09: qty 32

## 2017-06-09 NOTE — H&P (View-Only) (Signed)
[address] [place], [place]  [address] Phone: [phone] Fax:  [phone]  Date:  06/09/2017   ID:  Forbes, Loll [birth year], MRN [id number]  PCP:  Blair Heys, MD   History of Present Illness: Oscar Rodriguez is a 62 y.o. male with coronary artery disease status post CABG 08/21/10.  Had a lot of dyspnea prior to CABG. Tired with exertion. Here for follow up.  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 He states that he really like strength training.   He is join the [employer]. He has lost from 211-179 pounds. Does not like cardio.  His triglycerides have decreased from the 250 down to 90. He takes Nordic fish oil.   His LDL cholesterol is 77 goal 70.    Occasionally he may feel some mild chest twinge but nothing severe or significant. He is to monitor his exercise closely. We discussed this. He is also felt some burning at times with walking. We will continue to monitor.   Has stress at work.  Also notes some right greater than left trace edema. Post bypass. Stress at work continues. Prediabetic. Blood pressure medications changed.  05/26/17 -overall doing quite well no chest pain, no shortness of breath, no syncope, no bleeding. Sometimes feels a wash over him. If walking tract stops and rests. Some soreness.   06/09/17 - NUC stress today abnormal. CP. SOB, ECG changes, inferolateral ischemia. Now CP free. His main complaint is having to stop walking because of dyspnea.    Wt Readings from Last 3 Encounters:  06/09/17 224 lb (101.6 kg)  06/09/17 224 lb (101.6 kg)  05/26/17 224 lb (101.6 kg)     Past Medical History:  Diagnosis Date  . Coronary artery disease   . Hyperlipidemia   . Hypertension     Past Surgical History:  Procedure Laterality Date  . CARDIAC SURGERY     by pass    Current Outpatient Medications  Medication Sig Dispense Refill  . Ascorbic Acid (VITAMIN C) 1000 MG tablet Take 2,000 mg by mouth daily.    Marland Kitchen aspirin  81 MG tablet Take 81 mg by mouth daily.    Marland Kitchen atorvastatin (LIPITOR) 40 MG tablet TAKE 1 TABLET BY MOUTH EVERY DAY AT 6 PM 30 tablet 11  . cetirizine (ZYRTEC) 10 MG tablet Take 10 mg by mouth daily as needed for allergies.    Marland Kitchen GLUCOSAMINE HCL PO Take by mouth.    . losartan (COZAAR) 50 MG tablet Take 1 tablet (50 mg total) by mouth daily. 30 tablet 11  . metoprolol succinate (TOPROL-XL) 50 MG 24 hr tablet TAKE 1 TABLET BY MOUTH DAILY WITH OR IMMEDIATELY FOLLOWING A MEAL. 30 tablet 11  . Multiple Vitamin (MULTIVITAMIN) tablet Take 1 tablet by mouth daily.    . Omega-3 Fatty Acids (FISH OIL) 1000 MG CAPS Take 3,000 mg by mouth daily.    . vitamin B-12 (CYANOCOBALAMIN) 100 MCG tablet Take 100 mcg by mouth daily.    . vitamin E (VITAMIN E) 400 UNIT capsule Take 400 Units by mouth daily.     No current facility-administered medications for this visit.     Allergies:   No Known Allergies  Social History:  The patient  reports that he quit smoking about 13 years ago. He started smoking about 23 years ago. he has never used smokeless tobacco. works at [id number].  ROS:  Please see the history of present illness.   Had  some CP/SOB during stress. Main symptom is SOB. No bleeding, no orthopnea, no PND  PHYSICAL EXAM: VS:  BP 120/83   Pulse (!) 102   Ht 5\' 7"  (1.702 m)   Wt 224 lb (101.6 kg)   BMI 35.08 kg/m  GEN: Well nourished, well developed, in no acute distress overweight HEENT: normal  Neck: no JVD, carotid bruits, or masses Cardiac: RRR; no murmurs, rubs, or gallops,no edema  Respiratory:  clear to auscultation bilaterally, normal work of breathing GI: soft, nontender, nondistended, + BS MS: no deformity or atrophy  Skin: warm and dry, no rash Neuro:  Alert and Oriented x 3, Strength and sensation are intact Psych: euthymic mood, full affect   EKG:  EKG ordered today 04/08/16-sinus rhythm 81 bpm subtle Q waves 3 and aVF personally viewed-prior 07/16/13 to Normal rhythm, 70, subtle  nonspecific ST-T wave changes     ASSESSMENT AND PLAN:  1. Coronary artery disease-post bypass, abnormal nuclear stress test. -Stress test performed on 06/09/17, today, demonstrates marked T wave inversion on ECG as well as inferolateral wall ischemia.  There is possibly an obtuse marginal SVG graft that is down.  Prior to his bypass surgery he was feeling some increasing shortness of breath with exertion.  He is once again having to stop a couple times when working out, walking around the track, walking outside.  These may be his anginal symptoms. since his bypass took place in 2012.  We will pursue cardiac catheterization, risks and benefits have been explained including stroke heart attack renal impairment death.  Continue with aggressive secondary prevention. Aspirin. 2. Hyperlipidemia-excellent lipids.  No changes made.  Medications reviewed.  Overall doing quite well.  LDL 87, triglycerides 111, hemoglobin A1c 5.9.  No myalgias on medications. 3. Hypertension-no changes made, notes reviewed, medications reviewed continuing to do well with hypertension control, medications reviewed.   4. Edema-right greater than left, trivial.  Some of this is secondary to vein extraction.  Continue to monitor.  Chronic.  No changes. 5. Morbid Obesity (BMI greater than 35 with 2 comorbidities)-he has had swings in his weight.  See above for details.  Weight is high once again.  This could be contributing significantly to his shortness of breath as well.  Continue to encourage weight loss, diet, exercise.  No changes made. 6. Post cath APP.  We will see post cardiac catheterization  Signed, Donato SchultzMark Skains, MD [facility]  06/09/2017 1:53 PM

## 2017-06-09 NOTE — Progress Notes (Signed)
[address], [address] [place], [place]  [address] Phone: [phone] Fax:  [phone]  Date:  06/09/2017   ID:  Forbes, Loll [birth year], MRN [id number]  PCP:  Blair Heys, MD   History of Present Illness: Oscar Rodriguez is a 62 y.o. male with coronary artery disease status post CABG 08/21/10.  Had a lot of dyspnea prior to CABG. Tired with exertion. Here for follow up.  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 He states that he really like strength training.   He is join the [employer]. He has lost from 211-179 pounds. Does not like cardio.  His triglycerides have decreased from the 250 down to 90. He takes Nordic fish oil.   His LDL cholesterol is 77 goal 70.    Occasionally he may feel some mild chest twinge but nothing severe or significant. He is to monitor his exercise closely. We discussed this. He is also felt some burning at times with walking. We will continue to monitor.   Has stress at work.  Also notes some right greater than left trace edema. Post bypass. Stress at work continues. Prediabetic. Blood pressure medications changed.  05/26/17 -overall doing quite well no chest pain, no shortness of breath, no syncope, no bleeding. Sometimes feels a wash over him. If walking tract stops and rests. Some soreness.   06/09/17 - NUC stress today abnormal. CP. SOB, ECG changes, inferolateral ischemia. Now CP free. His main complaint is having to stop walking because of dyspnea.    Wt Readings from Last 3 Encounters:  06/09/17 224 lb (101.6 kg)  06/09/17 224 lb (101.6 kg)  05/26/17 224 lb (101.6 kg)     Past Medical History:  Diagnosis Date  . Coronary artery disease   . Hyperlipidemia   . Hypertension     Past Surgical History:  Procedure Laterality Date  . CARDIAC SURGERY     by pass    Current Outpatient Medications  Medication Sig Dispense Refill  . Ascorbic Acid (VITAMIN C) 1000 MG tablet Take 2,000 mg by mouth daily.    Marland Kitchen aspirin  81 MG tablet Take 81 mg by mouth daily.    Marland Kitchen atorvastatin (LIPITOR) 40 MG tablet TAKE 1 TABLET BY MOUTH EVERY DAY AT 6 PM 30 tablet 11  . cetirizine (ZYRTEC) 10 MG tablet Take 10 mg by mouth daily as needed for allergies.    Marland Kitchen GLUCOSAMINE HCL PO Take by mouth.    . losartan (COZAAR) 50 MG tablet Take 1 tablet (50 mg total) by mouth daily. 30 tablet 11  . metoprolol succinate (TOPROL-XL) 50 MG 24 hr tablet TAKE 1 TABLET BY MOUTH DAILY WITH OR IMMEDIATELY FOLLOWING A MEAL. 30 tablet 11  . Multiple Vitamin (MULTIVITAMIN) tablet Take 1 tablet by mouth daily.    . Omega-3 Fatty Acids (FISH OIL) 1000 MG CAPS Take 3,000 mg by mouth daily.    . vitamin B-12 (CYANOCOBALAMIN) 100 MCG tablet Take 100 mcg by mouth daily.    . vitamin E (VITAMIN E) 400 UNIT capsule Take 400 Units by mouth daily.     No current facility-administered medications for this visit.     Allergies:   No Known Allergies  Social History:  The patient  reports that he quit smoking about 13 years ago. He started smoking about 23 years ago. he has never used smokeless tobacco. works at [id number].  ROS:  Please see the history of present illness.   Had  some CP/SOB during stress. Main symptom is SOB. No bleeding, no orthopnea, no PND  PHYSICAL EXAM: VS:  BP 120/83   Pulse (!) 102   Ht 5\' 7"  (1.702 m)   Wt 224 lb (101.6 kg)   BMI 35.08 kg/m  GEN: Well nourished, well developed, in no acute distress overweight HEENT: normal  Neck: no JVD, carotid bruits, or masses Cardiac: RRR; no murmurs, rubs, or gallops,no edema  Respiratory:  clear to auscultation bilaterally, normal work of breathing GI: soft, nontender, nondistended, + BS MS: no deformity or atrophy  Skin: warm and dry, no rash Neuro:  Alert and Oriented x 3, Strength and sensation are intact Psych: euthymic mood, full affect   EKG:  EKG ordered today 04/08/16-sinus rhythm 81 bpm subtle Q waves 3 and aVF personally viewed-prior 07/16/13 to Normal rhythm, 70, subtle  nonspecific ST-T wave changes     ASSESSMENT AND PLAN:  1. Coronary artery disease-post bypass, abnormal nuclear stress test. -Stress test performed on 06/09/17, today, demonstrates marked T wave inversion on ECG as well as inferolateral wall ischemia.  There is possibly an obtuse marginal SVG graft that is down.  Prior to his bypass surgery he was feeling some increasing shortness of breath with exertion.  He is once again having to stop a couple times when working out, walking around the track, walking outside.  These may be his anginal symptoms. since his bypass took place in 2012.  We will pursue cardiac catheterization, risks and benefits have been explained including stroke heart attack renal impairment death.  Continue with aggressive secondary prevention. Aspirin. 2. Hyperlipidemia-excellent lipids.  No changes made.  Medications reviewed.  Overall doing quite well.  LDL 87, triglycerides 111, hemoglobin A1c 5.9.  No myalgias on medications. 3. Hypertension-no changes made, notes reviewed, medications reviewed continuing to do well with hypertension control, medications reviewed.   4. Edema-right greater than left, trivial.  Some of this is secondary to vein extraction.  Continue to monitor.  Chronic.  No changes. 5. Morbid Obesity (BMI greater than 35 with 2 comorbidities)-he has had swings in his weight.  See above for details.  Weight is high once again.  This could be contributing significantly to his shortness of breath as well.  Continue to encourage weight loss, diet, exercise.  No changes made. 6. Post cath APP.  We will see post cardiac catheterization  Signed, Donato SchultzMark Ilse Billman, MD [facility]  06/09/2017 1:53 PM

## 2017-06-09 NOTE — Patient Instructions (Signed)
Medication Instructions:  The current medical regimen is effective;  continue present plan and medications.  Labwork: Please have blood work today.  Testing/Procedures: Your physician has requested that you have a cardiac catheterization. Cardiac catheterization is used to diagnose and/or treat various heart conditions. Doctors may recommend this procedure for a number of different reasons. The most common reason is to evaluate chest pain. Chest pain can be a symptom of coronary artery disease (CAD), and cardiac catheterization can show whether plaque is narrowing or blocking your heart's arteries. This procedure is also used to evaluate the valves, as well as measure the blood flow and oxygen levels in different parts of your heart. For further information please visit [email or web address]. Please follow instruction sheet, as given.  Follow-Up: Follow up after your cardiac cath.  If you need a refill on your cardiac medications before your next appointment, please call your pharmacy.  Thank you for choosing [facility]!!      [facility] [address] [facility] [address], [address] [place] [address] Dept: [phone] Loc: [phone]  Oscar PennerCharles W Lindvall  06/09/2017  You are scheduled for a cardiac cath on  Wednesday June 11, 2017 with Dr. Tresa EndoKelly.  1. [facility] at [facility]: [address] [place], [place] [address] at 11 AM (two hours before your procedure to ensure your preparation). Free valet parking service is available.   Special note: Every effort is made to have your procedure done on time. Please understand that emergencies sometimes delay scheduled procedures.  2. Diet: Nothing to eat or drink after midnight.  3. Labs: please have blood work today (BMP,CBC)  4. Medication instructions in preparation for your procedure:  On the morning of  your procedure, take your ASA and any morning medicines.  You may use sips of water.  5. Plan for one night stay--bring personal belongings. 6. Bring a current list of your medications and current insurance cards. 7. You MUST have a responsible person to drive you home. 8. Someone MUST be with you the first 24 hours after you arrive home or your discharge will be delayed. 9. Please wear clothes that are easy to get on and off and wear slip-on shoes.  Thank you for allowing us to care for you!   -- [facility]

## 2017-06-10 ENCOUNTER — Telehealth: Payer: Self-pay | Admitting: *Deleted

## 2017-06-10 LAB — CBC
Hematocrit: 48.5 % (ref 37.5–51.0)
Hemoglobin: 16.6 g/dL (ref 13.0–17.7)
MCH: 32.5 pg (ref 26.6–33.0)
MCHC: 34.2 g/dL (ref 31.5–35.7)
MCV: 95 fL (ref 79–97)
Platelets: 157 10*3/uL (ref 150–379)
RBC: 5.1 x10E6/uL (ref 4.14–5.80)
RDW: 13.4 % (ref 12.3–15.4)
WBC: 6.7 10*3/uL (ref 3.4–10.8)

## 2017-06-10 LAB — BASIC METABOLIC PANEL
BUN/Creatinine Ratio: 15 (ref 10–24)
BUN: 15 mg/dL (ref 8–27)
CO2: 22 mmol/L (ref 20–29)
Calcium: 9.9 mg/dL (ref 8.6–10.2)
Chloride: 103 mmol/L (ref 96–106)
Creatinine, Ser: 1.01 mg/dL (ref 0.76–1.27)
GFR calc Af Amer: 93 mL/min/{1.73_m2} (ref >59)
GFR calc non Af Amer: 80 mL/min/{1.73_m2} (ref >59)
Glucose: 101 mg/dL — ABNORMAL HIGH (ref 65–99)
Potassium: 4.4 mmol/L (ref 3.5–5.2)
Sodium: 143 mmol/L (ref 134–144)

## 2017-06-10 NOTE — Telephone Encounter (Signed)
Pt contacted [facility]atheter[facility] for: Wednesday March 13,2019 1 PM Verified arrival time and place: [facility] at: 11 AM  Nothing to eat or drink after midnight prior to cath. Verified allergies in Epic.  Verified no diabetes medications.  AM meds can be  taken pre-cath with sip of water including: ASA 81 mg   Confirmed patient has responsible person to drive home post procedure and observe patient for 24 hours: yes

## 2017-06-11 ENCOUNTER — Ambulatory Visit (HOSPITAL_COMMUNITY)
Admission: RE | Admit: 2017-06-11 | Discharge: 2017-06-11 | Disposition: A | Discharge status: Home / Self Care | Payer: BLUE CROSS/BLUE SHIELD | Source: Ambulatory Visit | Attending: Cardiovascular Disease | Admitting: Cardiovascular Disease

## 2017-06-11 ENCOUNTER — Encounter (HOSPITAL_COMMUNITY)
Admission: RE | Disposition: A | Discharge status: Home / Self Care | Payer: Self-pay | Source: Ambulatory Visit | Attending: Cardiovascular Disease

## 2017-06-11 DIAGNOSIS — Z7982 Long term (current) use of aspirin: Secondary | ICD-10-CM | POA: Diagnosis not present

## 2017-06-11 DIAGNOSIS — I200 Unstable angina: Secondary | ICD-10-CM | POA: Diagnosis not present

## 2017-06-11 DIAGNOSIS — Z87891 Personal history of nicotine dependence: Secondary | ICD-10-CM | POA: Insufficient documentation

## 2017-06-11 DIAGNOSIS — E785 Hyperlipidemia, unspecified: Secondary | ICD-10-CM | POA: Diagnosis not present

## 2017-06-11 DIAGNOSIS — I25718 Atherosclerosis of autologous vein coronary artery bypass graft(s) with other forms of angina pectoris: Secondary | ICD-10-CM | POA: Diagnosis not present

## 2017-06-11 DIAGNOSIS — I208 Other forms of angina pectoris: Secondary | ICD-10-CM | POA: Diagnosis present

## 2017-06-11 DIAGNOSIS — R9439 Abnormal result of other cardiovascular function study: Secondary | ICD-10-CM | POA: Diagnosis not present

## 2017-06-11 DIAGNOSIS — Z79899 Other long term (current) drug therapy: Secondary | ICD-10-CM | POA: Insufficient documentation

## 2017-06-11 DIAGNOSIS — Z6835 Body mass index (BMI) 35.0-35.9, adult: Secondary | ICD-10-CM | POA: Diagnosis not present

## 2017-06-11 DIAGNOSIS — I2582 Chronic total occlusion of coronary artery: Secondary | ICD-10-CM | POA: Insufficient documentation

## 2017-06-11 DIAGNOSIS — Z951 Presence of aortocoronary bypass graft: Secondary | ICD-10-CM | POA: Diagnosis not present

## 2017-06-11 DIAGNOSIS — I10 Essential (primary) hypertension: Secondary | ICD-10-CM | POA: Insufficient documentation

## 2017-06-11 DIAGNOSIS — I2584 Coronary atherosclerosis due to calcified coronary lesion: Secondary | ICD-10-CM | POA: Insufficient documentation

## 2017-06-11 HISTORY — PX: LEFT HEART CATH AND CORS/GRAFTS ANGIOGRAPHY: CATH118250

## 2017-06-11 LAB — PROTIME-INR
INR: 0.99
Prothrombin Time: 13 seconds (ref 11.4–15.2)

## 2017-06-11 SURGERY — LEFT HEART CATH AND CORS/GRAFTS ANGIOGRAPHY
Anesthesia: LOCAL

## 2017-06-11 MED ORDER — SODIUM CHLORIDE 0.9 % WEIGHT BASED INFUSION
3.0000 mL/kg/h | INTRAVENOUS | Status: AC
  Administered 2017-06-11: 3 mL/kg/h via INTRAVENOUS

## 2017-06-11 MED ORDER — SODIUM CHLORIDE 0.9 % IV SOLN: INTRAVENOUS | Status: AC

## 2017-06-11 MED ORDER — MIDAZOLAM HCL 2 MG/2ML IJ SOLN
INTRAMUSCULAR | Status: AC
  Filled 2017-06-11: qty 2

## 2017-06-11 MED ORDER — SODIUM CHLORIDE 0.9 % WEIGHT BASED INFUSION: 1.0000 mL/kg/h | INTRAVENOUS | Status: DC

## 2017-06-11 MED ORDER — SODIUM CHLORIDE 0.9% FLUSH: 3.0000 mL | INTRAVENOUS | Status: DC | PRN

## 2017-06-11 MED ORDER — SODIUM CHLORIDE 0.9% FLUSH: 3.0000 mL | Freq: Two times a day (BID) | INTRAVENOUS | Status: DC

## 2017-06-11 MED ORDER — ISOSORBIDE MONONITRATE ER 30 MG PO TB24: 30.0000 mg | ORAL_TABLET | Freq: Every day | ORAL | 2 refills | Status: DC

## 2017-06-11 MED ORDER — ACETAMINOPHEN 325 MG PO TABS: 650.0000 mg | ORAL_TABLET | ORAL | Status: DC | PRN

## 2017-06-11 MED ORDER — SODIUM CHLORIDE 0.9 % IV SOLN: 250.0000 mL | INTRAVENOUS | Status: DC | PRN

## 2017-06-11 MED ORDER — ASPIRIN 81 MG PO CHEW: 81.0000 mg | CHEWABLE_TABLET | ORAL | Status: DC

## 2017-06-11 MED ORDER — IOPAMIDOL (ISOVUE-370) INJECTION 76%
INTRAVENOUS | Status: DC | PRN
  Administered 2017-06-11: 115 mL via INTRA_ARTERIAL

## 2017-06-11 MED ORDER — IOPAMIDOL (ISOVUE-370) INJECTION 76%
INTRAVENOUS | Status: AC
  Filled 2017-06-11: qty 125

## 2017-06-11 MED ORDER — ISOSORBIDE MONONITRATE ER 30 MG PO TB24: 30.0000 mg | ORAL_TABLET | Freq: Every day | ORAL | Status: DC

## 2017-06-11 MED ORDER — LIDOCAINE HCL 1 % IJ SOLN
INTRAMUSCULAR | Status: AC
  Filled 2017-06-11: qty 20

## 2017-06-11 MED ORDER — HEPARIN (PORCINE) IN NACL 2-0.9 UNIT/ML-% IJ SOLN
INTRAMUSCULAR | Status: AC | PRN
  Administered 2017-06-11 (×2): 500 mL via INTRA_ARTERIAL

## 2017-06-11 MED ORDER — FENTANYL CITRATE (PF) 100 MCG/2ML IJ SOLN
INTRAMUSCULAR | Status: AC
  Filled 2017-06-11: qty 2

## 2017-06-11 MED ORDER — HEPARIN (PORCINE) IN NACL 2-0.9 UNIT/ML-% IJ SOLN
INTRAMUSCULAR | Status: AC
  Filled 2017-06-11: qty 1000

## 2017-06-11 MED ORDER — ONDANSETRON HCL 4 MG/2ML IJ SOLN: 4.0000 mg | Freq: Four times a day (QID) | INTRAMUSCULAR | Status: DC | PRN

## 2017-06-11 MED ORDER — FENTANYL CITRATE (PF) 100 MCG/2ML IJ SOLN
INTRAMUSCULAR | Status: DC | PRN
  Administered 2017-06-11 (×3): 25 ug via INTRAVENOUS

## 2017-06-11 MED ORDER — LIDOCAINE HCL (PF) 1 % IJ SOLN
INTRAMUSCULAR | Status: DC | PRN
  Administered 2017-06-11: 15 mL via INTRADERMAL

## 2017-06-11 MED ORDER — MIDAZOLAM HCL 2 MG/2ML IJ SOLN
INTRAMUSCULAR | Status: DC | PRN
  Administered 2017-06-11: 1 mg via INTRAVENOUS
  Administered 2017-06-11: 2 mg via INTRAVENOUS
  Administered 2017-06-11: 1 mg via INTRAVENOUS

## 2017-06-11 SURGICAL SUPPLY — 12 items
CATH INFINITI 5 FR IM (CATHETERS) ×2 IMPLANT
CATH INFINITI 5 FR RCB (CATHETERS) ×2 IMPLANT
CATH INFINITI 5FR MULTPACK ANG (CATHETERS) ×2 IMPLANT
KIT HEART LEFT (KITS) ×2 IMPLANT
PACK CARDIAC CATHETERIZATION (CUSTOM PROCEDURE TRAY) ×2 IMPLANT
SHEATH AVANTI 11CM 5FR (SHEATH) ×2 IMPLANT
SYR MEDRAD MARK V 150ML (SYRINGE) ×2 IMPLANT
TRANSDUCER W/STOPCOCK (MISCELLANEOUS) ×2 IMPLANT
TUBING CIL FLEX 10 FLL-RA (TUBING) ×2 IMPLANT
WIRE EMERALD 3MM-J .035X150CM (WIRE) ×2 IMPLANT
WIRE EMERALD 3MM-J .035X260CM (WIRE) ×2 IMPLANT
WIRE EMERALD ST .035X150CM (WIRE) IMPLANT

## 2017-06-11 NOTE — Progress Notes (Signed)
Site area: rt groin fa sheath Site Prior to Removal:  Level 0 Pressure Applied For: 20 minutes Manual:    yes Patient Status During Pull:   stable Post Pull Site:  Level 0 Post Pull Instructions Given:  yes Post Pull Pulses Present: rt dp palpable Dressing Applied:  Gauze and tegaderm Bedrest begins @ 1520 Comments:

## 2017-06-11 NOTE — Discharge Instructions (Signed)
Isosorbide Mononitrate extended-release tablets What is this medicine? ISOSORBIDE MONONITRATE (eye soe SOR bide mon oh NYE trate) is a vasodilator. It relaxes blood vessels, increasing the blood and oxygen supply to your heart. This medicine is used to prevent chest pain caused by angina. It will not help to stop an episode of chest pain. This medicine may be used for other purposes; ask your health care provider or pharmacist if you have questions. COMMON BRAND NAME(S): Imdur, Isotrate ER What should I tell my health care provider before I take this medicine? They need to know if you have any of these conditions: -previous heart attack or heart failure -an unusual or allergic reaction to isosorbide mononitrate, nitrates, other medicines, foods, dyes, or preservatives -pregnant or trying to get pregnant -breast-feeding How should I use this medicine? Take this medicine by mouth with a glass of water. Follow the directions on the prescription label. Do not crush or chew. Take your medicine at regular intervals. Do not take your medicine more often than directed. Do not stop taking this medicine except on the advice of your doctor or health care professional. Talk to your pediatrician regarding the use of this medicine in children. Special care may be needed. Overdosage: If you think you have taken too much of this medicine contact a poison control center or emergency room at once. NOTE: This medicine is only for you. Do not share this medicine with others. What if I miss a dose? If you miss a dose, take it as soon as you can. If it is almost time for your next dose, take only that dose. Do not take double or extra doses. What may interact with this medicine? Do not take this medicine with any of the following medications: -medicines used to treat erectile dysfunction (ED) like avanafil, sildenafil, tadalafil, and vardenafil -riociguat This medicine may also interact with the following  medications: -medicines for high blood pressure -other medicines for angina or heart failure This list may not describe all possible interactions. Give your health care provider a list of all the medicines, herbs, non-prescription drugs, or dietary supplements you use. Also tell them if you smoke, drink alcohol, or use illegal drugs. Some items may interact with your medicine. What should I watch for while using this medicine? Check your heart rate and blood pressure regularly while you are taking this medicine. Ask your doctor or health care professional what your heart rate and blood pressure should be and when you should contact him or her. Tell your doctor or health care professional if you feel your medicine is no longer working. You may get dizzy. Do not drive, use machinery, or do anything that needs mental alertness until you know how this medicine affects you. To reduce the risk of dizzy or fainting spells, do not sit or stand up quickly, especially if you are an older patient. Alcohol can make you more dizzy, and increase flushing and rapid heartbeats. Avoid alcoholic drinks. Do not treat yourself for coughs, colds, or pain while you are taking this medicine without asking your doctor or health care professional for advice. Some ingredients may increase your blood pressure. What side effects may I notice from receiving this medicine? Side effects that you should report to your doctor or health care professional as soon as possible: -bluish discoloration of lips, fingernails, or palms of hands -irregular heartbeat, palpitations -low blood pressure -nausea, vomiting -persistent headache -unusually weak or tired Side effects that usually do not require medical attention (report  to your doctor or health care professional if they continue or are bothersome): -flushing of the face or neck -rash This list may not describe all possible side effects. Call your doctor for medical advice about side  effects. You may report side effects to FDA at 1-800-FDA-1088. Where should I keep my medicine? Keep out of the reach of children. Store between 15 and 30 degrees C (59 and 86 degrees F). Keep container tightly closed. Throw away any unused medicine after the expiration date. NOTE: This sheet is a summary. It may not cover all possible information. If you have questions about this medicine, talk to your doctor, pharmacist, or health care provider.  2018 Elsevier/Gold Standard (2013-01-15 14:48:19) Angiogram, Care After This sheet gives you information about how to care for yourself after your procedure. Your health care provider may also give you more specific instructions. If you have problems or questions, contact your health care provider. What can I expect after the procedure? After the procedure, it is common to have bruising and tenderness at the catheter insertion area. Follow these instructions at home: Insertion site care  Follow instructions from your health care provider about how to take care of your insertion site. Make sure you: ? Wash your hands with soap and water before you change your bandage (dressing). If soap and water are not available, use hand sanitizer. ? Change your dressing as told by your health care provider. ? Leave stitches (sutures), skin glue, or adhesive strips in place. These skin closures may need to stay in place for 2 weeks or longer. If adhesive strip edges start to loosen and curl up, you may trim the loose edges. Do not remove adhesive strips completely unless your health care provider tells you to do that.  Do not take baths, swim, or use a hot tub until your health care provider approves.  You may shower 24-48 hours after the procedure or as told by your health care provider. ? Gently wash the site with plain soap and water. ? Pat the area dry with a clean towel. ? Do not rub the site. This may cause bleeding.  Do not apply powder or lotion to the  site. Keep the site clean and dry.  Check your insertion site every day for signs of infection. Check for: ? Redness, swelling, or pain. ? Fluid or blood. ? Warmth. ? Pus or a bad smell. Activity  Rest as told by your health care provider, usually for 1-2 days.  Do not lift anything that is heavier than 10 lbs. (4.5 kg) or as told by your health care provider.  Do not drive for 24 hours if you were given a medicine to help you relax (sedative).  Do not drive or use heavy machinery while taking prescription pain medicine. General instructions  Return to your normal activities as told by your health care provider, usually in about a week. Ask your health care provider what activities are safe for you.  If the catheter site starts bleeding, lie flat and put pressure on the site. If the bleeding does not stop, get help right away. This is a medical emergency.  Drink enough fluid to keep your urine clear or pale yellow. This helps flush the contrast dye from your body.  Take over-the-counter and prescription medicines only as told by your health care provider.  Keep all follow-up visits as told by your health care provider. This is important. Contact a health care provider if:  You have a  fever or chills.  You have redness, swelling, or pain around your insertion site.  You have fluid or blood coming from your insertion site.  The insertion site feels warm to the touch.  You have pus or a bad smell coming from your insertion site.  You have bruising around the insertion site.  You notice blood collecting in the tissue around the catheter site (hematoma). The hematoma may be painful to the touch. Get help right away if:  You have severe pain at the catheter insertion area.  The catheter insertion area swells very fast.  The catheter insertion area is bleeding, and the bleeding does not stop when you hold steady pressure on the area.  The area near or just beyond the  catheter insertion site becomes pale, cool, tingly, or numb. These symptoms may represent a serious problem that is an emergency. Do not wait to see if the symptoms will go away. Get medical help right away. Call your local emergency services (911 in the [place] Do not drive yourself to the hospital. Summary  After the procedure, it is common to have bruising and tenderness at the catheter insertion area.  After the procedure, it is important to rest and drink plenty of fluids.  Do not take baths, swim, or use a hot tub until your health care provider says it is okay to do so. You may shower 24-48 hours after the procedure or as told by your health care provider.  If the catheter site starts bleeding, lie flat and put pressure on the site. If the bleeding does not stop, get help right away. This is a medical emergency. This information is not intended to replace advice given to you by your health care provider. Make sure you discuss any questions you have with your health care provider. Document Released: 10/04/2004 Document Revised: 02/21/2016 Document Reviewed: 02/21/2016 Elsevier Interactive Patient Education  [employer].

## 2017-06-11 NOTE — Interval H&P Note (Signed)
Cath Lab Visit (complete for each Cath Lab visit)  Clinical Evaluation Leading to the Procedure:   ACS: No.  Non-ACS:    Anginal Classification: CCS III  Anti-ischemic medical therapy: Minimal Therapy (1 class of medications)  Non-Invasive Test Results: High-risk stress test findings: cardiac mortality >3%/year  Prior CABG: Previous CABG      History and Physical Interval Note:  06/11/2017 1:48 PM  Oscar Rodriguez  has presented today for surgery, with the diagnosis of abn stess test  The various methods of treatment have been discussed with the patient and family. After consideration of risks, benefits and other options for treatment, the patient has consented to  Procedure(s): LEFT HEART CATH AND CORS/GRAFTS ANGIOGRAPHY (N/A) as a surgical intervention .  The patient's history has been reviewed, patient examined, no change in status, stable for surgery.  I have reviewed the patient's chart and labs.  Questions were answered to the patient's satisfaction.     Nicki Guadalajarahomas Demonta Wombles

## 2017-06-12 ENCOUNTER — Encounter (HOSPITAL_COMMUNITY): Payer: Self-pay | Admitting: Cardiovascular Disease

## 2017-06-12 ENCOUNTER — Encounter: Payer: Self-pay | Admitting: Cardiology

## 2017-06-12 MED FILL — Heparin Sodium (Porcine) 2 Unit/ML in Sodium Chloride 0.9%: INTRAMUSCULAR | Qty: 1000 | Status: AC

## 2017-06-12 MED FILL — Lidocaine HCl Local Inj 1%: INTRAMUSCULAR | Qty: 20 | Status: AC

## 2017-06-19 DIAGNOSIS — R1031 Right lower quadrant pain: Secondary | ICD-10-CM | POA: Diagnosis not present

## 2017-06-19 DIAGNOSIS — Z9889 Other specified postprocedural states: Secondary | ICD-10-CM | POA: Diagnosis not present

## 2017-06-19 DIAGNOSIS — T148XXA Other injury of unspecified body region, initial encounter: Secondary | ICD-10-CM | POA: Diagnosis not present

## 2017-06-19 DIAGNOSIS — I2510 Atherosclerotic heart disease of native coronary artery without angina pectoris: Secondary | ICD-10-CM | POA: Diagnosis not present

## 2017-07-03 ENCOUNTER — Encounter: Payer: Self-pay | Admitting: Cardiology

## 2017-07-03 ENCOUNTER — Ambulatory Visit (INDEPENDENT_AMBULATORY_CARE_PROVIDER_SITE_OTHER): Payer: BLUE CROSS/BLUE SHIELD | Admitting: Cardiology

## 2017-07-03 ENCOUNTER — Ambulatory Visit (INDEPENDENT_AMBULATORY_CARE_PROVIDER_SITE_OTHER): Payer: BLUE CROSS/BLUE SHIELD | Admitting: Orthopedic Surgery

## 2017-07-03 VITALS — BP 136/88 | HR 100 | Ht 67.0 in | Wt 214.0 lb

## 2017-07-03 DIAGNOSIS — M25511 Pain in right shoulder: Secondary | ICD-10-CM | POA: Diagnosis not present

## 2017-07-03 DIAGNOSIS — G8929 Other chronic pain: Secondary | ICD-10-CM

## 2017-07-03 DIAGNOSIS — M19011 Primary osteoarthritis, right shoulder: Secondary | ICD-10-CM | POA: Diagnosis not present

## 2017-07-03 DIAGNOSIS — I208 Other forms of angina pectoris: Secondary | ICD-10-CM | POA: Diagnosis not present

## 2017-07-03 NOTE — Patient Instructions (Signed)
Medication Instructions:  The current medical regimen is effective;  continue present plan and medications.  Follow-Up: Follow up in 3 months with Laura Ingold, NP.  You will receive a letter in the mail 2 months before you are due.  Please call us when you receive this letter to schedule your follow up appointment.  Follow up in 6 months with Dr. Skains.  You will receive a letter in the mail 2 months before you are due.  Please call us when you receive this letter to schedule your follow up appointment.  If you need a refill on your cardiac medications before your next appointment, please call your pharmacy.  Thank you for choosing [facility]!!     

## 2017-07-03 NOTE — Progress Notes (Signed)
[address] [place], [place]  [address] Phone: [phone] Fax:  [phone]  Date:  07/03/2017   ID:  Oscar MassingCharles W Schlottman, DOB 11/24/1955, MRN [id number]  PCP:  Blair HeysEhinger, Robert, MD   History of Present Illness: Lysbeth PennerCharles W Vinsant is a 62 y.o. male with coronary artery disease status post CABG 08/21/10.  Had a lot of dyspnea prior to CABG. Tired with exertion. Here for follow up.  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 He states that he really like strength training.   He is join the [employer]. He has lost from 211-179 pounds. Does not like cardio.  His triglycerides have decreased from the 250 down to 90. He takes Nordic fish oil.   His LDL cholesterol is 77 goal 70.    Occasionally he may feel some mild chest twinge but nothing severe or significant. He is to monitor his exercise closely. We discussed this. He is also felt some burning at times with walking. We will continue to monitor.   Has stress at work.  Also notes some right greater than left trace edema. Post bypass. Stress at work continues. Prediabetic. Blood pressure medications changed.  05/26/17 -overall doing quite well no chest pain, no shortness of breath, no syncope, no bleeding. Sometimes feels a wash over him. If walking tract stops and rests. Some soreness.   06/09/17 - NUC stress today abnormal. CP. SOB, ECG changes, inferolateral ischemia. Now CP free. His main complaint is having to stop walking because of dyspnea.   07/03/17 - Imdur - 1st dose could breath easier.  Feels better.  No chest pain.  Anxious to start exercising again.  Walking the track.   Wt Readings from Last 3 Encounters:  07/03/17 214 lb (97.1 kg)  06/11/17 214 lb (97.1 kg)  06/09/17 224 lb (101.6 kg)     Past Medical History:  Diagnosis Date  . Coronary artery disease   . Hyperlipidemia   . Hypertension     Past Surgical History:  Procedure Laterality Date  . CARDIAC SURGERY     by pass  . LEFT HEART CATH AND  CORS/GRAFTS ANGIOGRAPHY N/A 06/11/2017   Procedure: LEFT HEART CATH AND CORS/GRAFTS ANGIOGRAPHY;  Surgeon: Lennette BihariKelly, Thomas A, MD;  Location: [facility];  Service: Cardiovascular;  Laterality: N/A;    Current Outpatient Medications  Medication Sig Dispense Refill  . Ascorbic Acid (VITAMIN C) 1000 MG tablet Take 2,000 mg by mouth daily.    Marland Kitchen. aspirin 81 MG tablet Take 81 mg by mouth daily.    Marland Kitchen. atorvastatin (LIPITOR) 40 MG tablet TAKE 1 TABLET BY MOUTH EVERY DAY AT 6 PM 30 tablet 11  . cetirizine (ZYRTEC) 10 MG tablet Take 10 mg by mouth daily as needed for allergies.    Marland Kitchen. GLUCOSAMINE HCL PO Take 1 tablet by mouth daily.     . isosorbide mononitrate (IMDUR) 30 MG 24 hr tablet Take 1 tablet (30 mg total) by mouth daily. 30 tablet 2  . losartan (COZAAR) 50 MG tablet Take 1 tablet (50 mg total) by mouth daily. 30 tablet 11  . metoprolol succinate (TOPROL-XL) 50 MG 24 hr tablet TAKE 1 TABLET BY MOUTH DAILY WITH OR IMMEDIATELY FOLLOWING A MEAL. 30 tablet 11  . Multiple Vitamin (MULTIVITAMIN) tablet Take 1 tablet by mouth daily.    . Omega-3 Fatty Acids (FISH OIL) 1200 MG CAPS Take 2,400 mg by mouth daily.     . vitamin B-12 (CYANOCOBALAMIN) 100  MCG tablet Take 100 mcg by mouth daily.    . vitamin E (VITAMIN E) 400 UNIT capsule Take 400 Units by mouth daily.     No current facility-administered medications for this visit.     Allergies:    Allergies  Allergen Reactions  . Benadryl [Diphenhydramine] Other (See Comments)    Makes pt jittery - shaky     Social History:  The patient  reports that he quit smoking about 13 years ago. He started smoking about 23 years ago. He has never used smokeless tobacco. works at [id number].  ROS:  Please see the history of present illness.   Had some CP/SOB during stress. Main symptom is SOB. No bleeding, no orthopnea, no PND  PHYSICAL EXAM: VS:  BP 136/88   Pulse 100   Ht 5\' 7"  (1.702 m)   Wt 214 lb (97.1 kg)   BMI 33.52 kg/m  GEN: Well nourished, well  developed, in no acute distress  HEENT: normal  Neck: no JVD, carotid bruits, or masses Cardiac: RRR; no murmurs, rubs, or gallops,no edema  Respiratory:  clear to auscultation bilaterally, normal work of breathing GI: soft, nontender, nondistended, + BS MS: no deformity or atrophy  Skin: warm and dry, no rash Neuro:  Alert and Oriented x 3, Strength and sensation are intact Psych: euthymic mood, full affect    EKG:  EKG ordered today 04/08/16-sinus rhythm 81 bpm subtle Q waves 3 and aVF personally viewed-prior 07/16/13 to Normal rhythm, 70, subtle nonspecific ST-T wave changes    Cath 06/11/17:  Prox RCA to Mid RCA lesion is 100% stenosed.  Ost 2nd Mrg lesion is 100% stenosed.  Ost LAD to Prox LAD lesion is 95% stenosed.  Prox LAD lesion is 100% stenosed.  Origin lesion is 100% stenosed.  Origin lesion is 100% stenosed.  Origin lesion is 100% stenosed.  LV end diastolic pressure is mildly elevated.  The left ventricular systolic function is normal.  There is no mitral valve regurgitation.   Low normal global LV function with an ejection fraction estimate of 50% and mid-distal mild inferior hypocontractility.  Significant native CAD with evidence for coronary calcification involving all 3 native coronary arteries.  There was a 90% calcified very proximal LAD stenosis before the first diagonal vessel and the LAD was then occluded after this diagonal vessel.  The left circumflex coronary artery had total occlusion of its prominent marginal vessel, but there was evidence for bridging antegrade collaterals in a short segment after the occlusion as well as retrograde collaterals supplying this marginal vessel which bifurcated distally.  Total occlusion of the proximal RCA with bridging antegrade collaterals` and significant left to right distal collaterals from both the distal circumflex and LIMA to LAD.  Patent left internal mammary artery graft supplying the LAD which  fills retrograde up to the point of proximal occlusion extends beyond the apex distally, which collateralizes the distal RCA.  Ostial occlusion of all 3 saphenous vein grafts  which had previously supplied the diagonal vessel, the marginal vessel, and the PDA branch of the RCA.  RECOMMENDATION: Recommend initial increased medical therapy trial with addition of isosorbide mononitrate to his current regimen.  Consider future addition of amlodipine or possible ranolazine.  If continued symptoms persist, consider attempt at intervention to the circumflex marginal vessel.      ASSESSMENT AND PLAN:  Coronary artery disease-post bypass, -Stress test performed on 06/09/17, today, demonstrates marked T wave inversion on ECG as well as inferolateral wall ischemia.  Prior to his bypass surgery he was feeling some increasing shortness of breath with exertion.  He is once again having to stop a couple times when working out, walking around the track, walking outside.  These may be his anginal symptoms. since his bypass took place in 2012.  We went ahead and performed cardiac catheterization which showed ostial occlusion of all 3 saphenous vein grafts which had previously supplied the diagonal vessel, obtuse marginal vessel and the PDA.  The recommendation was to increase medical therapy with isosorbide.  Consider amlodipine over no losing in the future.  If continued symptoms persist consider an attempted intervention to the circumflex.  Continue with aggressive secondary prevention. Aspirin.  He seems to be doing better with the isosorbide.  Mild frontal headache.  If we have to, we can decrease it to 15 mg but we would like to continue with 30 for now.  Hyperlipidemia-excellent lipids.  No changes made.  Medications reviewed.  Overall doing quite well.  LDL 87, triglycerides 111, hemoglobin A1c 5.9.  No myalgias on medications.  Hypertension-no changes made, notes reviewed, medications reviewed continuing to  do well with hypertension control, medications reviewed.    Edema-right greater than left, trivial.  Some of this is secondary to vein extraction.  Continue to monitor.  Chronic.  No changes.  Morbid Obesity (BMI greater than 35 with 2 comorbidities)-he has had swings in his weight.  See above for details.  Weight is high once again.  This could be contributing significantly to his shortness of breath as well.  Continue to encourage weight loss, diet, exercise.  No changes made.  3 month with Vernona Rieger, 6 with me.  Signed, Donato Schultz, MD [facility]  07/03/2017 5:17 PM

## 2017-07-04 ENCOUNTER — Other Ambulatory Visit: Payer: Self-pay | Admitting: Cardiology

## 2017-07-06 ENCOUNTER — Encounter (INDEPENDENT_AMBULATORY_CARE_PROVIDER_SITE_OTHER): Payer: Self-pay | Admitting: Orthopedic Surgery

## 2017-07-06 DIAGNOSIS — M19011 Primary osteoarthritis, right shoulder: Secondary | ICD-10-CM | POA: Diagnosis not present

## 2017-07-06 MED ORDER — LIDOCAINE HCL 1 % IJ SOLN
3.0000 mL | INTRAMUSCULAR | Status: AC | PRN
  Administered 2017-07-06: 3 mL

## 2017-07-06 MED ORDER — BUPIVACAINE HCL 0.25 % IJ SOLN
0.6600 mL | INTRAMUSCULAR | Status: AC | PRN
  Administered 2017-07-06: .66 mL via INTRA_ARTICULAR

## 2017-07-06 MED ORDER — METHYLPREDNISOLONE ACETATE 40 MG/ML IJ SUSP
13.3300 mg | INTRAMUSCULAR | Status: AC | PRN
  Administered 2017-07-06: 13.33 mg via INTRA_ARTICULAR

## 2017-07-06 NOTE — Progress Notes (Signed)
Office Visit Note   Patient: Oscar Rodriguez           Date of Birth: 15-Apr-1955           MRN: [id number] Visit Date: 07/03/2017 Requested by: Blair HeysEhinger, Robert, MD [address] [place], [place] [address] PCP: Blair HeysEhinger, Robert, MD  Subjective: Chief Complaint  Patient presents with  . Right Shoulder - Pain, Follow-up    HPI: Oscar MostCharles is a patient with right shoulder pain.  He has good and bad days.  He would like to discuss having an injection.  He did have an AC joint injection done on 10/16/2016 which gave him good relief for about 3 months.  It hurts him on the superior aspect of the right shoulder.  Takes an aspirin for his symptoms.  He cannot sleep on the right-hand side.  Denies any weakness or mechanical symptoms in the right shoulder.  Denies any restricted motion.              ROS: All systems reviewed are negative as they relate to the chief complaint within the history of present illness.  Patient denies  fevers or chills.   Assessment & Plan: Visit Diagnoses:  1. 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  He has some type of heart procedure coming up.  We will inject his shoulder today to get some relief.  Could consider surgical intervention or yearly injections depending on his symptoms.  No evidence of rotator cuff pathology or frozen shoulder today.  Follow-Up Instructions: Return if symptoms worsen or fail to improve.   Orders:  No orders of the defined types were placed in this encounter.  No orders of the defined types were placed in this encounter.     Procedures: Medium Joint Inj: R acromioclavicular on 07/06/2017 11:09 PM Indications: diagnostic evaluation and pain Details: 25 G 1.5 in needle, ultrasound-guided superior approach Medications: 3 mL lidocaine 1 %; 0.66 mL bupivacaine 0.25 %; 13.33 mg methylPREDNISolone acetate 40 MG/ML Outcome: tolerated well,  no immediate complications Procedure, treatment alternatives, risks and benefits explained, specific risks discussed. Consent was given by the patient. Immediately prior to procedure a time out was called to verify the correct patient, procedure, equipment, support staff and site/side marked as required. Patient was prepped and draped in the usual sterile fashion.       Clinical Data: No additional findings.  Objective: Vital Signs: There were no vitals taken for this visit.  Physical Exam:   Constitutional: Patient appears well-developed HEENT:  Head: Normocephalic Eyes:EOM are normal Neck: Normal range of motion Cardiovascular: Normal rate Pulmonary/chest: Effort normal Neurologic: Patient is alert Skin: Skin is warm Psychiatric: Patient has normal mood and affect    Ortho Exam: Orthopedic exam demonstrates full active and passive range of motion of the right shoulder.  There is AC joint tenderness to direct palpation more on the right than the left.  Rotator cuff strength is intact to isolated infraspinatus supraspinatus and subscap muscle testing.  No masses lymphadenopathy or skin changes noted in the shoulder girdle region.  No restriction of passive or active range of motion in that right shoulder.  Negative apprehension relocation testing negative O'Brien's testing on the right  Specialty Comments:  No specialty comments available.  Imaging: No results found.   PMFS History: Patient Active Problem List   Diagnosis Date Noted  . Unstable angina (HCC)   .  Coronary atherosclerosis of native coronary artery 07/16/2013  . Obesity 07/16/2013  . Pure hypercholesterolemia 07/16/2013  . Essential hypertension, benign 07/16/2013   Past Medical History:  Diagnosis Date  . Coronary artery disease   . Hyperlipidemia   . Hypertension     History reviewed. No pertinent family history.  Past Surgical History:  Procedure Laterality Date  . CARDIAC SURGERY     by pass    . LEFT HEART CATH AND CORS/GRAFTS ANGIOGRAPHY N/A 06/11/2017   Procedure: LEFT HEART CATH AND CORS/GRAFTS ANGIOGRAPHY;  Surgeon: Lennette Bihari, MD;  Location: [facility];  Service: Cardiovascular;  Laterality: N/A;   Social History   Occupational History  . Not on file  Tobacco Use  . Smoking status: Former Smoker    Start date: 07/16/1993    Last attempt to quit: 07/17/2003    Years since quitting: 13.9  . Smokeless tobacco: Never Used  Substance and Sexual Activity  . Alcohol use: Not on file  . Drug use: Not on file  . Sexual activity: Not on file

## 2017-12-03 ENCOUNTER — Encounter: Payer: Self-pay | Admitting: Cardiology

## 2017-12-15 DIAGNOSIS — R7303 Prediabetes: Secondary | ICD-10-CM | POA: Diagnosis not present

## 2017-12-15 DIAGNOSIS — Z23 Encounter for immunization: Secondary | ICD-10-CM | POA: Diagnosis not present

## 2017-12-15 DIAGNOSIS — K7581 Nonalcoholic steatohepatitis (NASH): Secondary | ICD-10-CM | POA: Diagnosis not present

## 2017-12-15 DIAGNOSIS — E7800 Pure hypercholesterolemia, unspecified: Secondary | ICD-10-CM | POA: Diagnosis not present

## 2017-12-15 DIAGNOSIS — I10 Essential (primary) hypertension: Secondary | ICD-10-CM | POA: Diagnosis not present

## 2017-12-17 NOTE — Progress Notes (Signed)
Cardiology Office Note   Date:  12/18/2017   ID:  Oscar PennerCharles W Mian, DOB Nov 02, 1955, MRN [id number]  PCP:  Blair HeysEhinger, Robert, MD  Cardiologist:  Dr. Anne FuSkains    Chief Complaint  Patient presents with  . Coronary Artery Disease      History of Present Illness: Oscar Rodriguez is a 62 y.o. male who presents for CAD.    Pt has a hx of coronary artery disease status post CABG 08/21/10.  Had a lot of dyspnea prior to CABG. Tired with exertion  06/09/17 - NUC stress today abnormal. CP. SOB, ECG changes, inferolateral ischemia. Now CP free. His main complaint is having to stop walking because of dyspnea.  pt had cath 06/11/17 with Ef 50%, + native CAD, Patent left internal mammary artery graft supplying the LAD which fills retrograde up to the point of proximal occlusion extends beyond the apex distally, which collateralizes the distal RCA.  Medical therapy planned.   Ostial occlusion of all 3 saphenous vein grafts  which had previously supplied the diagonal vessel, the marginal vessel, and the PDA branch of the RCA.  07/03/17 - Imdur - 1st dose could breath easier.  Feels better.  No chest pain.  Anxious to start exercising again.  Walking the track.  Today no chest pain but some dyspnea with exertion.  Only occ-- overall he believes the imdur to be helping.   He has backed off exercise due to lack of parking, he did buy exercise bike.  He tries to eat healthy.   We reviewed his cath pictures.  Also recent lipids are good but LFTs elevated. Though stable.  He has hx of this and has been worked up with GI.  Would like to check in 6 weeks and will check with Dr. Anne FuSkains on continuing the statin.     Also pt with dizziness at times, some with sitting, no awareness of rapid or irregular HR.    Past Medical History:  Diagnosis Date  . Coronary artery disease   . Hyperlipidemia   . Hypertension     Past Surgical History:  Procedure Laterality Date  . CARDIAC SURGERY     by pass  . LEFT HEART  CATH AND CORS/GRAFTS ANGIOGRAPHY N/A 06/11/2017   Procedure: LEFT HEART CATH AND CORS/GRAFTS ANGIOGRAPHY;  Surgeon: Lennette BihariKelly, Thomas A, MD;  Location: [facility];  Service: Cardiovascular;  Laterality: N/A;     Current Outpatient Medications  Medication Sig Dispense Refill  . Ascorbic Acid (VITAMIN C) 1000 MG tablet Take 2,000 mg by mouth daily.    Marland Kitchen. aspirin 81 MG tablet Take 81 mg by mouth daily.    Marland Kitchen. atorvastatin (LIPITOR) 40 MG tablet TAKE 1 TABLET BY MOUTH EVERY DAY AT 6 PM 30 tablet 11  . cetirizine (ZYRTEC) 10 MG tablet Take 10 mg by mouth daily as needed for allergies.    Marland Kitchen. GLUCOSAMINE HCL PO Take 1 tablet by mouth daily.     . isosorbide mononitrate (IMDUR) 30 MG 24 hr tablet TAKE 1 TABLET BY MOUTH EVERY DAY 90 tablet 3  . losartan (COZAAR) 50 MG tablet Take 1 tablet (50 mg total) by mouth daily. 30 tablet 11  . metoprolol succinate (TOPROL-XL) 50 MG 24 hr tablet TAKE 1 TABLET BY MOUTH DAILY WITH OR IMMEDIATELY FOLLOWING A MEAL. 30 tablet 11  . Multiple Vitamin (MULTIVITAMIN) tablet Take 1 tablet by mouth daily.    . Omega-3 Fatty Acids (FISH OIL) 1200 MG CAPS Take 2,400 mg  by mouth daily.     . vitamin B-12 (CYANOCOBALAMIN) 100 MCG tablet Take 100 mcg by mouth daily.    . vitamin E (VITAMIN E) 400 UNIT capsule Take 400 Units by mouth daily.     No current facility-administered medications for this visit.     Allergies:   Benadryl [diphenhydramine]    Social History:  The patient  reports that he quit smoking about 14 years ago. He started smoking about 24 years ago. He has never used smokeless tobacco. He reports that he does not drink alcohol or use drugs.   Family History:  The patient's family history includes Dementia in his mother; Heart attack in his maternal uncle.    ROS:  General:no colds or fevers, no weight changes Skin:no rashes or ulcers HEENT:no blurred vision, no congestion CV:see HPI PUL:see HPI GI:no diarrhea constipation or melena, no  indigestion GU:no hematuria, no dysuria MS:+ rt shoulder pain Has seen ortho has had injections.  no claudication Neuro:no syncope, no lightheadedness Endo:no diabetes-+ pre diabetes. , no thyroid disease  Wt Readings from Last 3 Encounters:  12/18/17 219 lb 8 oz (99.6 kg)  07/03/17 214 lb (97.1 kg)  06/11/17 214 lb (97.1 kg)     PHYSICAL EXAM: VS:  BP 118/78   Pulse 75   Ht 5\' 7"  (1.702 m)   Wt 219 lb 8 oz (99.6 kg)   SpO2 96%   BMI 34.38 kg/m  , BMI Body mass index is 34.38 kg/m. General:Pleasant affect, NAD Skin:Warm and dry, brisk capillary refill HEENT:normocephalic, sclera clear, mucus membranes moist Neck:supple, no JVD, no bruits  Heart:S1S2 RRR without murmur, gallup, rub or click Lungs:clear without rales, rhonchi, or wheezes WUJ:WJXB, non tender, + BS, do not palpate liver spleen or masses Ext:no lower ext edema, 2+ pedal pulses, 2+ radial pulses Neuro:alert and oriented X 3, MAE, follows commands, + facial symmetry    EKG:  EKG is NOT ordered today.    Recent Labs: 06/09/2017: BUN 15; Creatinine, Ser 1.01; Hemoglobin 16.6; Platelets 157; Potassium 4.4; Sodium 143    Lipid Panel    Component Value Date/Time   CHOL 120 07/05/2013 0835   TRIG 72.0 07/05/2013 0835   HDL 41.40 07/05/2013 0835   CHOLHDL 3 07/05/2013 0835   VLDL 14.4 07/05/2013 0835   LDLCALC 64 07/05/2013 0835       Other studies Reviewed: Additional studies/ records that were reviewed today include:   Cath 05/2017 .  Prox RCA to Mid RCA lesion is 100% stenosed.  Ost 2nd Mrg lesion is 100% stenosed.  Ost LAD to Prox LAD lesion is 95% stenosed.  Prox LAD lesion is 100% stenosed.  Origin lesion is 100% stenosed.  Origin lesion is 100% stenosed.  Origin lesion is 100% stenosed.  LV end diastolic pressure is mildly elevated.  The left ventricular systolic function is normal.  There is no mitral valve regurgitation.   Low normal global LV function with an ejection fraction  estimate of 50% and mid-distal mild inferior hypocontractility.  Significant native CAD with evidence for coronary calcification involving all 3 native coronary arteries.  There was a 90% calcified very proximal LAD stenosis before the first diagonal vessel and the LAD was then occluded after this diagonal vessel.  The left circumflex coronary artery had total occlusion of its prominent marginal vessel, but there was evidence for bridging antegrade collaterals in a short segment after the occlusion as well as retrograde collaterals supplying this marginal vessel which bifurcated distally.  Total occlusion of the proximal RCA with bridging antegrade collaterals` and significant left to right distal collaterals from both the distal circumflex and LIMA to LAD.  Patent left internal mammary artery graft supplying the LAD which fills retrograde up to the point of proximal occlusion extends beyond the apex distally, which collateralizes the distal RCA.  Ostial occlusion of all 3 saphenous vein grafts  which had previously supplied the diagonal vessel, the marginal vessel, and the PDA branch of the RCA.  RECOMMENDATION: Recommend initial increased medical therapy trial with addition of isosorbide mononitrate to his current regimen.  Consider future addition of amlodipine or possible ranolazine.  If continued symptoms persist, consider attempt at intervention to the circumflex marginal vessel.     ASSESSMENT AND PLAN:  1.  CAD with hx CABG and 3 VG occluded, LIMA to LAD patent.  Lt to Rt collaterals.  Now only mild dyspnea with exertion, continue imdur at 30 mg.    2.  HLD stable on statin.  Continue  3.  HTN controlled  4.  NASH with elevated LFTs would like to recheck in 6 weeks, they seem stable. Will check with Dr. Anne Fu to continue statin.  Discussed with pt importance of exercise and wt loss of 5-10 labs - concern for conversion to cirrhosis.     Current medicines are reviewed  with the patient today.  The patient Has no concerns regarding medicines.  The following changes have been made:  See above Labs/ tests ordered today include:see above  Disposition:   FU:  see above  Signed, Nada Boozer, NP  12/18/2017 9:03 AM    [facility] [address] [place], [place], [place]  [address]/ [address] [place], [place] Phone: [phone]; Fax: [phone]  [phone]

## 2017-12-18 ENCOUNTER — Ambulatory Visit (INDEPENDENT_AMBULATORY_CARE_PROVIDER_SITE_OTHER): Payer: BLUE CROSS/BLUE SHIELD | Admitting: Cardiology

## 2017-12-18 ENCOUNTER — Encounter: Payer: Self-pay | Admitting: Cardiology

## 2017-12-18 VITALS — BP 118/78 | HR 75 | Ht 67.0 in | Wt 219.5 lb

## 2017-12-18 DIAGNOSIS — I10 Essential (primary) hypertension: Secondary | ICD-10-CM

## 2017-12-18 DIAGNOSIS — I2510 Atherosclerotic heart disease of native coronary artery without angina pectoris: Secondary | ICD-10-CM

## 2017-12-18 DIAGNOSIS — R748 Abnormal levels of other serum enzymes: Secondary | ICD-10-CM | POA: Diagnosis not present

## 2017-12-18 DIAGNOSIS — I208 Other forms of angina pectoris: Secondary | ICD-10-CM

## 2017-12-18 DIAGNOSIS — E7800 Pure hypercholesterolemia, unspecified: Secondary | ICD-10-CM | POA: Diagnosis not present

## 2017-12-18 NOTE — Patient Instructions (Addendum)
Medication Instructions:  Your physician recommends that you continue on your current medications as directed. Please refer to the Current Medication list given to you today.   Labwork: 6 WEEKS:  LFT  Testing/Procedures: None ordered  Follow-Up Your physician recommends that you schedule a follow-up appointment in: 1ST AVAILABLE WITH DR. Anne FuSKAINS  Any Other Special Instructions Will Be Listed Below (If Applicable).     If you need a refill on your cardiac medications before your next appointment, please call your pharmacy.

## 2018-01-29 DIAGNOSIS — R748 Abnormal levels of other serum enzymes: Secondary | ICD-10-CM | POA: Diagnosis not present

## 2018-02-02 ENCOUNTER — Telehealth: Payer: Self-pay | Admitting: *Deleted

## 2018-02-02 NOTE — Telephone Encounter (Signed)
-----   Message from Leone Brand, NP sent at 02/02/2018  6:04 AM EST ----- LFTs still elevated send copy to PCP

## 2018-02-02 NOTE — Telephone Encounter (Signed)
Called pt re: lab results and left a message for pt to call back. 

## 2018-02-05 NOTE — Telephone Encounter (Signed)
See my chart message

## 2018-06-05 DIAGNOSIS — E7800 Pure hypercholesterolemia, unspecified: Secondary | ICD-10-CM | POA: Diagnosis not present

## 2018-06-05 DIAGNOSIS — R7303 Prediabetes: Secondary | ICD-10-CM | POA: Diagnosis not present

## 2018-06-05 DIAGNOSIS — K7581 Nonalcoholic steatohepatitis (NASH): Secondary | ICD-10-CM | POA: Diagnosis not present

## 2018-06-05 DIAGNOSIS — Z125 Encounter for screening for malignant neoplasm of prostate: Secondary | ICD-10-CM | POA: Diagnosis not present

## 2018-06-05 DIAGNOSIS — Z23 Encounter for immunization: Secondary | ICD-10-CM | POA: Diagnosis not present

## 2018-06-05 DIAGNOSIS — I10 Essential (primary) hypertension: Secondary | ICD-10-CM | POA: Diagnosis not present

## 2018-06-18 ENCOUNTER — Other Ambulatory Visit: Payer: Self-pay | Admitting: Cardiology

## 2018-08-07 DIAGNOSIS — Z23 Encounter for immunization: Secondary | ICD-10-CM | POA: Diagnosis not present

## 2018-08-17 ENCOUNTER — Telehealth: Payer: Self-pay | Admitting: Cardiology

## 2018-08-17 NOTE — Telephone Encounter (Signed)
Pt. Has smart phone    Virtual Visit Pre-Appointment Phone Call  "(Name), I am calling you today to discuss your upcoming appointment. We are currently trying to limit exposure to the virus that causes COVID-19 by seeing patients at home rather than in the office."  1. "What is the BEST phone number to call the day of the visit?" - include this in appointment notes  2. Do you have or have access to (through a family member/friend) a smartphone with video capability that we can use for your visit?" a. If yes - list this number in appt notes as cell (if different from BEST phone #) and list the appointment type as a VIDEO visit in appointment notes b. If no - list the appointment type as a PHONE visit in appointment notes  3. Confirm consent - "In the setting of the current Covid19 crisis, you are scheduled for a (phone or video) visit with your provider on (date) at (time).  Just as we do with many in-office visits, in order for you to participate in this visit, we must obtain consent.  If you'd like, I can send this to your mychart (if signed up) or email for you to review.  Otherwise, I can obtain your verbal consent now.  All virtual visits are billed to your insurance company just like a normal visit would be.  By agreeing to a virtual visit, we'd like you to understand that the technology does not allow for your provider to perform an examination, and thus may limit your provider's ability to fully assess your condition. If your provider identifies any concerns that need to be evaluated in person, we will make arrangements to do so.  Finally, though the technology is pretty good, we cannot assure that it will always work on either your or our end, and in the setting of a video visit, we may have to convert it to a phone-only visit.  In either situation, we cannot ensure that we have a secure connection.  Are you willing to proceed?"  YES  4. Advise patient to be prepared - "Two hours prior to  your appointment, go ahead and check your blood pressure, pulse, oxygen saturation, and your weight (if you have the equipment to check those) and write them all down. When your visit starts, your provider will ask you for this information. If you have an Apple Watch or Kardia device, please plan to have heart rate information ready on the day of your appointment. Please have a pen and paper handy nearby the day of the visit as well."  5. Give patient instructions for MyChart download to smartphone OR [email or web address] as below if video visit (depending on what platform provider is using)  6. Inform patient they will receive a phone call 15 minutes prior to their appointment time (may be from unknown caller ID) so they should be prepared to answer    TELEPHONE CALL NOTE  Oscar Rodriguez has been deemed a candidate for a follow-up tele-health visit to limit community exposure during the Covid-19 pandemic. I spoke with the patient via phone to ensure availability of phone/video source, confirm preferred email & phone number, and discuss instructions and expectations.  I reminded Oscar Rodriguez to be prepared with any vital sign and/or heart rhythm information that could potentially be obtained via home monitoring, at the time of his visit. I reminded Oscar Rodriguez to expect a phone call prior to his visit.  Scherrie Bateman 08/17/2018 10:21  AM   INSTRUCTIONS FOR DOWNLOADING THE MYCHART APP TO SMARTPHONE  - The patient must first make sure to have activated MyChart and know their login information - If Apple, go to [employer] Store and type in MyChart in the search bar and download the app. If Android, ask patient to go to [employer] Play Store and type in [place] in the search bar and download the app. The app is free but as with any other app downloads, their phone may require them to verify saved payment information or Apple/Android password.  - The patient will need to then log into the app with their  MyChart username and password, and select [facility] as their healthcare provider to link the account. When it is time for your visit, go to the MyChart app, find appointments, and click Begin Video Visit. Be sure to Select Allow for your device to access the Microphone and Camera for your visit. You will then be connected, and your provider will be with you shortly.  **If they have any issues connecting, or need assistance please contact MyChart service desk [phone])**  **If using a computer, in order to ensure the best quality for their visit they will need to use either of the following Internet Browsers: [employer] Edge, or Google Chrome**  IF USING DOXIMITY or DOXY.ME - The patient will receive a link just prior to their visit by text.     FULL LENGTH CONSENT FOR TELE-HEALTH VISIT   I hereby voluntarily request, consent and authorize [facility] and its employed or contracted physicians, physician assistants, nurse practitioners or other licensed health care professionals (the Practitioner), to provide me with telemedicine health care services (the Services") as deemed necessary by the treating Practitioner. I acknowledge and consent to receive the Services by the Practitioner via telemedicine. I understand that the telemedicine visit will involve communicating with the Practitioner through live audiovisual communication technology and the disclosure of certain medical information by electronic transmission. I acknowledge that I have been given the opportunity to request an in-person assessment or other available alternative prior to the telemedicine visit and am voluntarily participating in the telemedicine visit.  I understand that I have the right to withhold or withdraw my consent to the use of telemedicine in the course of my care at any time, without affecting my right to future care or treatment, and that the Practitioner or I may terminate the telemedicine visit at  any time. I understand that I have the right to inspect all information obtained and/or recorded in the course of the telemedicine visit and may receive copies of available information for a reasonable fee.  I understand that some of the potential risks of receiving the Services via telemedicine include:   Delay or interruption in medical evaluation due to technological equipment failure or disruption;  Information transmitted may not be sufficient (e.g. poor resolution of images) to allow for appropriate medical decision making by the Practitioner; and/or   In rare instances, security protocols could fail, causing a breach of personal health information.  Furthermore, I acknowledge that it is my responsibility to provide information about my medical history, conditions and care that is complete and accurate to the best of my ability. I acknowledge that Practitioner's advice, recommendations, and/or decision may be based on factors not within their control, such as incomplete or inaccurate data provided by me or distortions of diagnostic images or specimens that may result from electronic transmissions. I understand that the practice of medicine is not  an Chief Strategy Officer and that Practitioner makes no warranties or guarantees regarding treatment outcomes. I acknowledge that I will receive a copy of this consent concurrently upon execution via email to the email address I last provided but may also request a printed copy by calling the office of [facility].    I understand that my insurance will be billed for this visit.   I have read or had this consent read to me.  I understand the contents of this consent, which adequately explains the benefits and risks of the Services being provided via telemedicine.   I have been provided ample opportunity to ask questions regarding this consent and the Services and have had my questions answered to my satisfaction.  I give my informed consent for the  services to be provided through the use of telemedicine in my medical care  By participating in this telemedicine visit I agree to the above.

## 2018-08-17 NOTE — Telephone Encounter (Signed)
appt scheduled with Dr Anne Fu as noted.

## 2018-08-25 ENCOUNTER — Telehealth (INDEPENDENT_AMBULATORY_CARE_PROVIDER_SITE_OTHER): Payer: BLUE CROSS/BLUE SHIELD | Admitting: Cardiology

## 2018-08-25 ENCOUNTER — Encounter: Payer: Self-pay | Admitting: Cardiology

## 2018-08-25 ENCOUNTER — Other Ambulatory Visit: Payer: Self-pay

## 2018-08-25 VITALS — BP 121/85 | HR 82 | Ht 67.0 in | Wt 211.0 lb

## 2018-08-25 DIAGNOSIS — I2510 Atherosclerotic heart disease of native coronary artery without angina pectoris: Secondary | ICD-10-CM

## 2018-08-25 DIAGNOSIS — I208 Other forms of angina pectoris: Secondary | ICD-10-CM | POA: Diagnosis not present

## 2018-08-25 DIAGNOSIS — I10 Essential (primary) hypertension: Secondary | ICD-10-CM

## 2018-08-25 DIAGNOSIS — R748 Abnormal levels of other serum enzymes: Secondary | ICD-10-CM

## 2018-08-25 NOTE — Patient Instructions (Signed)
Medication Instructions:  The current medical regimen is effective;  continue present plan and medications.  If you need a refill on your cardiac medications before your next appointment, please call your pharmacy.   Follow-Up: At [facility], you and your health needs are our priority.  As part of our continuing mission to provide you with exceptional heart care, we have created designated Provider Care Teams.  These Care Teams include your primary Cardiologist (physician) and Advanced Practice Providers (APPs -  Physician Assistants and Nurse Practitioners) who all work together to provide you with the care you need, when you need it. You will need a follow up appointment in 6 months with Laura Ingold, NP and 12 months with Dr Skains.  Please call our office 2 months in advance to schedule this appointment.  You may see Mark Skains, MD or one of the following Advanced Practice Providers on your designated Care Team:   Lori Gerhardt, NP Laura Ingold, NP . Jill McDaniel, NP  Thank you for choosing [facility]!!     

## 2018-08-25 NOTE — Progress Notes (Signed)
Virtual Visit via Video Note   This visit type was conducted due to national recommendations for restrictions regarding the COVID-19 Pandemic (e.g. social distancing) in an effort to limit this patient's exposure and mitigate transmission in our community.  Due to his co-morbid illnesses, this patient is at least at moderate risk for complications without adequate follow up.  This format is felt to be most appropriate for this patient at this time.  All issues noted in this document were discussed and addressed.  A limited physical exam was performed with this format.  Please refer to the patient's chart for his consent to telehealth for [facility].   Date:  08/25/2018   ID:  Oscar Rodriguez, DOB 1955/04/27, MRN [id number]  Patient Location: Home Provider Location: Home  PCP:  Blair Heys, MD  Cardiologist:  Donato Schultz, MD  Electrophysiologist:  None   Evaluation Performed:  Follow-Up Visit  Chief Complaint: CAD follow-up  History of Present Illness:    Oscar Rodriguez is a 63 y.o. male with CABG 2012 with anginal symptoms dyspnea on exertion who underwent cardiac catheterization on 06/18/2017 with patent LIMA filling retrograde up to the point of proximal occlusion and collateralization with distal RCA noted.  Medical therapy.  Has ostial occlusion of all 3 vein grafts.  Isosorbide was helped started to breathe a little bit easier.  LFTs had been elevated, stable.  AST 64, ALT 91 on 06/05/2018 has been worked up by gastroenterology.  On statin.  Elita Boone.  LDL 69.  He has been working from home.  This is helped out his stress levels.  Did not like to drive his commute 20 minutes each way.  Hoping that he can continue to work from home for the time being.  Denies any chest pain fevers chills nausea vomiting syncope.  The patient does not have symptoms concerning for COVID-19 infection (fever, chills, cough, or new shortness of breath).    Past Medical History:  Diagnosis Date  .  Coronary artery disease   . Hyperlipidemia   . Hypertension    Past Surgical History:  Procedure Laterality Date  . CARDIAC SURGERY     by pass  . LEFT HEART CATH AND CORS/GRAFTS ANGIOGRAPHY N/A 06/11/2017   Procedure: LEFT HEART CATH AND CORS/GRAFTS ANGIOGRAPHY;  Surgeon: Lennette Bihari, MD;  Location: [facility];  Service: Cardiovascular;  Laterality: N/A;     Current Meds  Medication Sig  . Ascorbic Acid (VITAMIN C) 1000 MG tablet Take 2,000 mg by mouth daily.  Marland Kitchen aspirin 81 MG tablet Take 81 mg by mouth daily.  Marland Kitchen atorvastatin (LIPITOR) 40 MG tablet TAKE 1 TABLET BY MOUTH EVERY DAY AT 6 PM  . cetirizine (ZYRTEC) 10 MG tablet Take 10 mg by mouth daily as needed for allergies.  Marland Kitchen GLUCOSAMINE HCL PO Take 1 tablet by mouth daily.   . isosorbide mononitrate (IMDUR) 30 MG 24 hr tablet TAKE 1 TABLET BY MOUTH EVERY DAY  . losartan (COZAAR) 50 MG tablet Take 1 tablet (50 mg total) by mouth daily.  . metoprolol succinate (TOPROL-XL) 50 MG 24 hr tablet TAKE 1 TABLET BY MOUTH DAILY WITH OR IMMEDIATELY FOLLOWING A MEAL.  . Multiple Vitamin (MULTIVITAMIN) tablet Take 1 tablet by mouth daily.  . Omega-3 Fatty Acids (FISH OIL) 1200 MG CAPS Take 2,400 mg by mouth daily.   . vitamin B-12 (CYANOCOBALAMIN) 100 MCG tablet Take 100 mcg by mouth daily.  . vitamin E (VITAMIN E) 400 UNIT capsule  Take 400 Units by mouth daily.     Allergies:   Benadryl [diphenhydramine]   Social History   Tobacco Use  . Smoking status: Former Smoker    Start date: 07/16/1993    Last attempt to quit: 07/17/2003    Years since quitting: 15.1  . Smokeless tobacco: Never Used  Substance Use Topics  . Alcohol use: Never    Frequency: Never  . Drug use: Never     Family Hx: The patient's family history includes Dementia in his mother; Heart attack in his maternal uncle.  ROS:   Please see the history of present illness.    Denies any fevers chills bleeding orthopnea PND. All other systems reviewed and are  negative.   Prior CV studies:   The following studies were reviewed today:  Cath 2019: Diagnostic  Dominance: Right      Labs/Other Tests and Data Reviewed:    EKG:  An ECG dated 05/26/17 was personally reviewed today and demonstrated:  Normal sinus rhythm possible old inferior infarct pattern  Recent Labs: No results found for requested labs within last 8760 hours.   Recent Lipid Panel Lab Results  Component Value Date/Time   CHOL 120 07/05/2013 08:35 AM   TRIG 72.0 07/05/2013 08:35 AM   HDL 41.40 07/05/2013 08:35 AM   CHOLHDL 3 07/05/2013 08:35 AM   LDLCALC 64 07/05/2013 08:35 AM    Wt Readings from Last 3 Encounters:  08/25/18 211 lb (95.7 kg)  12/18/17 219 lb 8 oz (99.6 kg)  07/03/17 214 lb (97.1 kg)     Objective:    Vital Signs:  BP 121/85   Pulse 82   Ht 5\' 7"  (1.702 m)   Wt 211 lb (95.7 kg)   BMI 33.05 kg/m    VITAL SIGNS:  reviewed GEN:  no acute distress EYES:  sclerae anicteric, EOMI - Extraocular Movements Intact RESPIRATORY:  normal respiratory effort, symmetric expansion SKIN:  no rash, lesions or ulcers. MUSCULOSKELETAL:  no obvious deformities. NEURO:  alert and oriented x 3, no obvious focal deficit PSYCH:  normal affect  ASSESSMENT & PLAN:    Coronary artery disease status post CABG with occluded vein grafts, patent LAD LIMA with anginal symptoms - Isosorbide.  Helping.  Decreased shortness of breath.  Continue with exercise, weight loss.  Aggressive secondary prevention to help reduce risk of heart attack stroke.  Elevated liver enzymes - Have been previously followed by Dr. Manus Gunning.  Has seen gastroenterology as well. NAFLD.  I am okay with him continuing with statin given his extensive coronary artery disease if it is okay with gastroenterology.  Hyperlipidemia -Continue with statin therapy.  ALT has been stable at approximately 90.  This is not greater than 3 times the upper limit of normal.  Seen by gastroenterology, Dr. Bosie Clos in  the past.  Essential hypertension -On losartan.  Doing well.  Well-controlled.    COVID-19 Education: The signs and symptoms of COVID-19 were discussed with the patient and how to seek care for testing (follow up with PCP or arrange E-visit).  The importance of social distancing was discussed today.  Time:   Today, I have spent 21 minutes with the patient with telehealth technology discussing the above problems.     Medication Adjustments/Labs and Tests Ordered: Current medicines are reviewed at length with the patient today.  Concerns regarding medicines are outlined above.   Tests Ordered: No orders of the defined types were placed in this encounter.   Medication Changes: No  orders of the defined types were placed in this encounter.   Disposition:  Follow up in 6 month(s)  Signed, Donato SchultzMark Gustava Berland, MD  08/25/2018 9:50 AM     [facility]

## 2018-12-11 ENCOUNTER — Other Ambulatory Visit: Payer: Self-pay | Admitting: Cardiology

## 2018-12-11 ENCOUNTER — Encounter: Payer: Self-pay | Admitting: Cardiology

## 2018-12-11 DIAGNOSIS — R7303 Prediabetes: Secondary | ICD-10-CM | POA: Diagnosis not present

## 2018-12-11 DIAGNOSIS — I10 Essential (primary) hypertension: Secondary | ICD-10-CM | POA: Diagnosis not present

## 2018-12-11 DIAGNOSIS — E7800 Pure hypercholesterolemia, unspecified: Secondary | ICD-10-CM | POA: Diagnosis not present

## 2018-12-11 DIAGNOSIS — K7581 Nonalcoholic steatohepatitis (NASH): Secondary | ICD-10-CM | POA: Diagnosis not present

## 2018-12-11 DIAGNOSIS — Z6835 Body mass index (BMI) 35.0-35.9, adult: Secondary | ICD-10-CM | POA: Diagnosis not present

## 2019-03-18 NOTE — Progress Notes (Signed)
Cardiology Office Note   Date:  03/22/2019   ID:  Jobe, Mutch 1955-10-23, MRN [id number]  PCP:  Gaynelle Arabian, MD  Cardiologist:  Dr. Marlou Porch, MD   Chief Complaint  Patient presents with  . Follow-up    History of Present Illness: Oscar Rodriguez is a 63 y.o. male who presents for follow up, seen for Dr. Marlou Porch.   Mr. Lipsey has a hx of CAD s/p CABG 2012 with anginal symptoms dyspnea on exertion who last underwent LHC on 05/2017 that showed patent LIMA filling retrograde up to the point of proximal occlusion, collateralization with distal RCA noted and ostial occlusion of all 3 vein grafts. Medical therapy was recommended.   Imdur was added to his regimen with improvement. Noted to have issues with elevated LFTs for which he has been followed by GI.   Today Mr. Arrona presents for follow-up and reports he has been doing fairly well from a cardiac perspective.  He denies chest pain or palpitations.  Denies LE swelling, orthopnea, changes in weight, dizziness, presyncopal or syncopal symptoms.  Is having exertional shortness of breath, mildly changed from previous office visit.  He feels this is an the setting of deconditioning.  Reports that since he was last seen in the office by Dr. Marlou Porch, he has decreased his physical activity as the gyms are closed in the setting of Covid.  Discussed increasing his physical activity and following up closely.  May need further work-up with echocardiogram if symptoms worsen or do not improve with increased exercise.  Also reports symptoms of sleep apnea including snoring and daytime fatigue.  Given this, will refer for sleep apnea work-up.    Past Medical History:  Diagnosis Date  . Coronary artery disease   . Hyperlipidemia   . Hypertension     Past Surgical History:  Procedure Laterality Date  . CARDIAC SURGERY     by pass  . LEFT HEART CATH AND CORS/GRAFTS ANGIOGRAPHY N/A 06/11/2017   Procedure: LEFT HEART CATH AND CORS/GRAFTS  ANGIOGRAPHY;  Surgeon: Troy Sine, MD;  Location: [place] [facility];  Service: Cardiovascular;  Laterality: N/A;     Current Outpatient Medications  Medication Sig Dispense Refill  . Ascorbic Acid (VITAMIN C) 1000 MG tablet Take 2,000 mg by mouth daily.    Marland Kitchen aspirin 81 MG tablet Take 81 mg by mouth daily.    Marland Kitchen atorvastatin (LIPITOR) 40 MG tablet TAKE 1 TABLET BY MOUTH EVERY DAY AT 6 PM 30 tablet 11  . cetirizine (ZYRTEC) 10 MG tablet Take 10 mg by mouth daily as needed for allergies.    Marland Kitchen GLUCOSAMINE HCL PO Take 1 tablet by mouth daily.     . isosorbide mononitrate (IMDUR) 30 MG 24 hr tablet TAKE 1 TABLET BY MOUTH EVERY DAY 90 tablet 2  . losartan (COZAAR) 50 MG tablet Take 1 tablet (50 mg total) by mouth daily. 30 tablet 11  . metoprolol succinate (TOPROL-XL) 50 MG 24 hr tablet TAKE 1 TABLET BY MOUTH DAILY WITH OR IMMEDIATELY FOLLOWING A MEAL. 30 tablet 11  . Multiple Vitamin (MULTIVITAMIN) tablet Take 1 tablet by mouth daily.    . Omega-3 Fatty Acids (FISH OIL) 1200 MG CAPS Take 2,400 mg by mouth daily.     . vitamin B-12 (CYANOCOBALAMIN) 100 MCG tablet Take 100 mcg by mouth daily.    . vitamin E (VITAMIN E) 400 UNIT capsule Take 400 Units by mouth daily.     No current  facility-administered medications for this visit.    Allergies:   Benadryl [diphenhydramine]   Social History:  The patient  reports that he quit smoking about 15 years ago. He started smoking about 25 years ago. He has never used smokeless tobacco. He reports that he does not drink alcohol or use drugs.   Family History:  The patient'sfamily history includes Dementia in his mother; Heart attack in his maternal uncle.    ROS:  Please see the history of present illness.  Otherwise, review of systems are positive for none.   All other systems are reviewed and negative.    PHYSICAL EXAM: VS:  BP 124/78   Pulse 76   Ht 5' 7"  (1.702 m)   Wt 223 lb 12.8 oz (101.5 kg)   SpO2 96%   BMI 35.05 kg/m  , BMI Body  mass index is 35.05 kg/m.   General: Well developed, well nourished, NAD Neck: Negative for carotid bruits. No JVD Lungs:Clear to ausculation bilaterally. No wheezes, rales, or rhonchi. Breathing is unlabored. Cardiovascular: RRR with S1 S2. No murmurs, rubs, gallops, or LV heave appreciated. Neuro: Alert and oriented. No focal deficits. No facial asymmetry. MAE spontaneously. Psych: Responds to questions appropriately with normal affect.     EKG:  EKG is ordered today. The ekg ordered today demonstrates NSR with no acute changes, similar to prior tracing.    Recent Labs: No results found for requested labs within last 8760 hours.    Lipid Panel    Component Value Date/Time   CHOL 120 07/05/2013 0835   TRIG 72.0 07/05/2013 0835   HDL 41.40 07/05/2013 0835   CHOLHDL 3 07/05/2013 0835   VLDL 14.4 07/05/2013 0835   LDLCALC 64 07/05/2013 0835      Wt Readings from Last 3 Encounters:  03/22/19 223 lb 12.8 oz (101.5 kg)  08/25/18 211 lb (95.7 kg)  12/18/17 219 lb 8 oz (99.6 kg)      Other studies Reviewed: Additional studies/ records that were reviewed today include:   Cath 2019: Diagnostic  Dominance: Right      ASSESSMENT AND PLAN:  1. CAD s/p CABG with known occluded vein grafts, patent LAD>LIMA and stable angina: -Continue Imdur -Denies CV symptoms  -We will obtain lab work today  2. Elevated LFTs: -Folowed by GI -Per Dr. Marlou Porch, ok to continue statin therapy given extensive CAD -We will obtain lab work today  3. HLD: -Continue statin as above   4. HTN: -Stable, 124/78 -Continue current regimen  5.  Possible sleep apnea: -Reports snoring and daytime sleepiness -Will refer for sleep apnea work-up  6.  Shortness of breath: -Reports likely in the setting of deconditioning -If no change with increased physical activity over the next month or 2, plan for repeat echocardiogram -Denies chest pain   Current medicines are reviewed at length with the  patient today.  The patient does not have concerns regarding medicines.  The following changes have been made:  no change  Labs/ tests ordered today include: CMET, Lipid  Orders Placed This Encounter  Procedures  . Comp Met (CMET)  . Lipid Profile  . EKG 12-Lead    Disposition:   FU with Dr. Marlou Porch or APP in 3 months    Signed, Kathyrn Drown, NP  03/22/2019 9:44 AM    [address], [place], [place]  [address] Phone: [phone]; Fax: [phone]

## 2019-03-18 NOTE — Progress Notes (Deleted)
Cardiology Office Note   Date:  03/18/2019   ID:  Oscar Rodriguez 01-Nov-1955, MRN [id number]  PCP:  Gaynelle Arabian, MD  Cardiologist:  Dr. Candee Furbish, MD   No chief complaint on file.     History of Present Illness: Oscar Rodriguez is a 64 y.o. male who presents for CAD follow up, seen for Dr. Marlou Rodriguez.   Mr. Oscar Rodriguez has a history of CABG 2012 with anginal symptoms of  DOE. Repeat cardiac catheterization on 06/18/2017 showed patent LIMA filling retrograde up to the point of proximal occlusion,  collateralization with distal RCA noted and ostial occlusion of all 3 vein grafts with recommendations for continued medical therapy.  Imdur was added to regimen to help with SOB. AST 64, ALT 91 on 06/05/2018 have been worked up by gastroenterology.   He was last seen by Dr. Marlou Rodriguez in telemedicine visit on 08/25/2018 and had no CV symptoms.   1. CAD s/p CABG with occluded vein grafts, patent LAD>LIMA with chronic anginal symptoms: -Stable,  -Continue Imdur -Continue with increasing exercise as tolerated -Continue   2. Elevated liver enzymes: -Followed by GI  -AST/ALT have been stable in the 90's  -Dr. Marlou Rodriguez ok with continuing statin in the setting of extensive CAD  3. HLD: -LDL,  -Continue statin as aabove -Repeat labs today  -CMET, LFTS  4. HTN: -Stable,  -Continue losartan -Labs today as above    Past Medical History:  Diagnosis Date  . Coronary artery disease   . Hyperlipidemia   . Hypertension     Past Surgical History:  Procedure Laterality Date  . CARDIAC SURGERY     by pass  . LEFT HEART CATH AND CORS/GRAFTS ANGIOGRAPHY N/A 06/11/2017   Procedure: LEFT HEART CATH AND CORS/GRAFTS ANGIOGRAPHY;  Surgeon: Troy Sine, MD;  Location: [place] [facility];  Service: Cardiovascular;  Laterality: N/A;     Current Outpatient Medications  Medication Sig Dispense Refill  . Ascorbic Acid (VITAMIN C) 1000 MG tablet Take 2,000 mg by mouth daily.    Marland Kitchen aspirin 81  MG tablet Take 81 mg by mouth daily.    Marland Kitchen atorvastatin (LIPITOR) 40 MG tablet TAKE 1 TABLET BY MOUTH EVERY DAY AT 6 PM 30 tablet 11  . cetirizine (ZYRTEC) 10 MG tablet Take 10 mg by mouth daily as needed for allergies.    Marland Kitchen GLUCOSAMINE HCL PO Take 1 tablet by mouth daily.     . isosorbide mononitrate (IMDUR) 30 MG 24 hr tablet TAKE 1 TABLET BY MOUTH EVERY DAY 90 tablet 2  . losartan (COZAAR) 50 MG tablet Take 1 tablet (50 mg total) by mouth daily. 30 tablet 11  . metoprolol succinate (TOPROL-XL) 50 MG 24 hr tablet TAKE 1 TABLET BY MOUTH DAILY WITH OR IMMEDIATELY FOLLOWING A MEAL. 30 tablet 11  . Multiple Vitamin (MULTIVITAMIN) tablet Take 1 tablet by mouth daily.    . Omega-3 Fatty Acids (FISH OIL) 1200 MG CAPS Take 2,400 mg by mouth daily.     . vitamin B-12 (CYANOCOBALAMIN) 100 MCG tablet Take 100 mcg by mouth daily.    . vitamin E (VITAMIN E) 400 UNIT capsule Take 400 Units by mouth daily.     No current facility-administered medications for this visit.    Allergies:   Benadryl [diphenhydramine]    Social History:  The patient  reports that he quit smoking about 15 years ago. He started smoking about 25 years ago. He has never used smokeless tobacco.  He reports that he does not drink alcohol or use drugs.   Family History:  The patient's ***family history includes Dementia in his mother; Heart attack in his maternal uncle.    ROS:  Please see the history of present illness.   Otherwise, review of systems are positive for {NONE DEFAULTED:18576::"none"}.   All other systems are reviewed and negative.    PHYSICAL EXAM: VS:  There were no vitals taken for this visit. , BMI There is no height or weight on file to calculate BMI. GEN: Well nourished, well developed, in no acute distress HEENT: normal Neck: no JVD, carotid bruits, or masses Cardiac: ***RRR; no murmurs, rubs, or gallops,no edema  Respiratory:  clear to auscultation bilaterally, normal work of breathing GI: soft, nontender,  nondistended, + BS MS: no deformity or atrophy Skin: warm and dry, no rash Neuro:  Strength and sensation are intact Psych: euthymic mood, full affect   EKG:  EKG {ACTION; IS/IS IDP:[id number]} ordered today. The ekg ordered today demonstrates ***   Recent Labs: No results found for requested labs within last 8760 hours.    Lipid Panel    Component Value Date/Time   CHOL 120 07/05/2013 0835   TRIG 72.0 07/05/2013 0835   HDL 41.40 07/05/2013 0835   CHOLHDL 3 07/05/2013 0835   VLDL 14.4 07/05/2013 0835   LDLCALC 64 07/05/2013 0835      Wt Readings from Last 3 Encounters:  08/25/18 211 lb (95.7 kg)  12/18/17 219 lb 8 oz (99.6 kg)  07/03/17 214 lb (97.1 kg)     Other studies Reviewed: Additional studies/ records that were reviewed today include:   Cath 2019: Diagnostic  Dominance: Right        ASSESSMENT AND PLAN:  1.  ***   Current medicines are reviewed at length with the patient today.  The patient {ACTIONS; HAS/DOES NOT HAVE:19233} concerns regarding medicines.  The following changes have been made:  {PLAN; NO CHANGE:13088:s}  Labs/ tests ordered today include: *** No orders of the defined types were placed in this encounter.    Disposition:   FU with *** in {gen number 1-44:315400} {Days to years:10300}  Signed, Georgie Chard, NP  03/18/2019 9:17 AM    [facility] [address] [place], [place], [place]  [address] Phone: [phone]; Fax: [phone]

## 2019-03-22 ENCOUNTER — Ambulatory Visit (INDEPENDENT_AMBULATORY_CARE_PROVIDER_SITE_OTHER): Payer: BC Managed Care – PPO | Admitting: Cardiology

## 2019-03-22 ENCOUNTER — Other Ambulatory Visit: Payer: Self-pay

## 2019-03-22 ENCOUNTER — Encounter: Payer: Self-pay | Admitting: Cardiology

## 2019-03-22 ENCOUNTER — Telehealth: Payer: Self-pay | Admitting: *Deleted

## 2019-03-22 ENCOUNTER — Ambulatory Visit: Payer: BLUE CROSS/BLUE SHIELD | Admitting: Cardiology

## 2019-03-22 VITALS — BP 124/78 | HR 76 | Ht 67.0 in | Wt 223.8 lb

## 2019-03-22 DIAGNOSIS — R0602 Shortness of breath: Secondary | ICD-10-CM

## 2019-03-22 DIAGNOSIS — R5381 Other malaise: Secondary | ICD-10-CM

## 2019-03-22 DIAGNOSIS — R748 Abnormal levels of other serum enzymes: Secondary | ICD-10-CM

## 2019-03-22 DIAGNOSIS — R0683 Snoring: Secondary | ICD-10-CM

## 2019-03-22 DIAGNOSIS — I10 Essential (primary) hypertension: Secondary | ICD-10-CM

## 2019-03-22 DIAGNOSIS — R400 Somnolence: Secondary | ICD-10-CM

## 2019-03-22 DIAGNOSIS — R5383 Other fatigue: Secondary | ICD-10-CM

## 2019-03-22 DIAGNOSIS — I2510 Atherosclerotic heart disease of native coronary artery without angina pectoris: Secondary | ICD-10-CM | POA: Diagnosis not present

## 2019-03-22 NOTE — Addendum Note (Signed)
Addended by: Mady Haagensen on: 03/22/2019 10:19 AM   Modules accepted: Orders

## 2019-03-22 NOTE — Telephone Encounter (Signed)
-----   Message from Mady Haagensen, [place] sent at 03/22/2019 10:19 AM EST ----- Regarding: At home sleep study Patient needs at home sleep study. I put [facility] as to where it needs to be done. I am not sure if this is correct, please let me know if I need to change anything in the order.

## 2019-03-22 NOTE — Patient Instructions (Addendum)
Medication Instructions:   Your physician recommends that you continue on your current medications as directed. Please refer to the Current Medication list given to you today.  *If you need a refill on your cardiac medications before your next appointment, please call your pharmacy*  Lab Work:  You will have labs drawn today: CMET and Lipids  If you have labs (blood work) drawn today and your tests are completely normal, you will receive your results only by: Marland Kitchen MyChart Message (if you have MyChart) OR . A paper copy in the mail If you have any lab test that is abnormal or we need to change your treatment, we will call you to review the results.  Testing/Procedures:  Your physician has recommended that you have a sleep study. This test records several body functions during sleep, including: brain activity, eye movement, oxygen and carbon dioxide blood levels, heart rate and rhythm, breathing rate and rhythm, the flow of air through your mouth and nose, snoring, body muscle movements, and chest and belly movement.  Follow-Up: At [facility], you and your health needs are our priority.  As part of our continuing mission to provide you with exceptional heart care, we have created designated Provider Care Teams.  These Care Teams include your primary Cardiologist (physician) and Advanced Practice Providers (APPs -  Physician Assistants and Nurse Practitioners) who all work together to provide you with the care you need, when you need it.  Your next appointment:    On 06/25/18 at [facility] with Kathyrn Drown, NP

## 2019-03-23 ENCOUNTER — Telehealth: Payer: Self-pay | Admitting: *Deleted

## 2019-03-23 LAB — LIPID PANEL
Chol/HDL Ratio: 3.7 ratio (ref 0.0–5.0)
Cholesterol, Total: 143 mg/dL (ref 100–199)
HDL: 39 mg/dL — ABNORMAL LOW (ref >39)
LDL Chol Calc (NIH): 80 mg/dL (ref 0–99)
Triglycerides: 135 mg/dL (ref 0–149)
VLDL Cholesterol Cal: 24 mg/dL (ref 5–40)

## 2019-03-23 LAB — COMPREHENSIVE METABOLIC PANEL
ALT: 97 IU/L — ABNORMAL HIGH (ref 0–44)
AST: 86 IU/L — ABNORMAL HIGH (ref 0–40)
Albumin/Globulin Ratio: 1.6 (ref 1.2–2.2)
Albumin: 4.1 g/dL (ref 3.8–4.8)
Alkaline Phosphatase: 81 IU/L (ref 39–117)
BUN/Creatinine Ratio: 12 (ref 10–24)
BUN: 13 mg/dL (ref 8–27)
Bilirubin Total: 0.7 mg/dL (ref 0.0–1.2)
CO2: 20 mmol/L (ref 20–29)
Calcium: 9.9 mg/dL (ref 8.6–10.2)
Chloride: 103 mmol/L (ref 96–106)
Creatinine, Ser: 1.06 mg/dL (ref 0.76–1.27)
GFR calc Af Amer: 87 mL/min/{1.73_m2} (ref >59)
GFR calc non Af Amer: 75 mL/min/{1.73_m2} (ref >59)
Globulin, Total: 2.6 g/dL (ref 1.5–4.5)
Glucose: 96 mg/dL (ref 65–99)
Potassium: 5.1 mmol/L (ref 3.5–5.2)
Sodium: 140 mmol/L (ref 134–144)
Total Protein: 6.7 g/dL (ref 6.0–8.5)

## 2019-03-23 NOTE — Telephone Encounter (Signed)
Staff message sent to Charlott Holler received from [facility] for HST. Order #[id number]. Valid dates 03/23/19 to 09/19/19.

## 2019-03-26 IMAGING — NM NM MISC PROCEDURE
5 series · 30 of 30 positions shown · non-contrast
Comparison: none

[Series 1: wbr_r-proj_st rest · 6.51mm/px · 6 of 64 frames shown]
[frame 6/64]
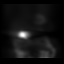
[frame 16/64]
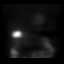
[frame 27/64]
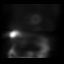
[frame 38/64]
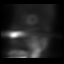
[frame 48/64]
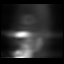
[frame 59/64]
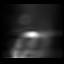

[Series 1: rest · 6.51mm/px · 6 of 64 frames shown]
[frame 6/64]
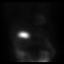
[frame 16/64]
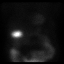
[frame 27/64]
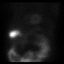
[frame 38/64]
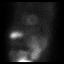
[frame 48/64]
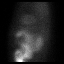
[frame 59/64]
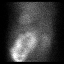

[Series 2: wbr_s-proj_st stress - gated · 6.51mm/px · 6 of 512 frames shown]
[frame 43/512]
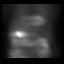
[frame 128/512]
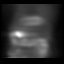
[frame 214/512]
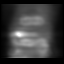
[frame 299/512]
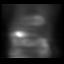
[frame 384/512]
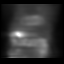
[frame 470/512]
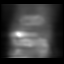

[Series 2: stress - gated · 6.51mm/px · 6 of 512 frames shown]
[frame 43/512]
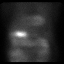
[frame 128/512]
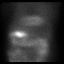
[frame 214/512]
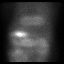
[frame 299/512]
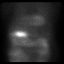
[frame 384/512]
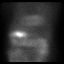
[frame 470/512]
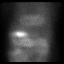

[Series 3: stress - perfusion · 6.51mm/px · 6 of 64 frames shown]
[frame 6/64]
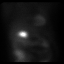
[frame 16/64]
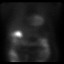
[frame 27/64]
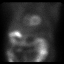
[frame 38/64]
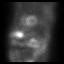
[frame 48/64]
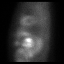
[frame 59/64]
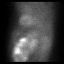

[30 of 30 positions shown; findings below may reference images not displayed]

Canned report from images found in remote index.

Refer to host system for actual result text.

## 2019-04-26 ENCOUNTER — Ambulatory Visit: Payer: Self-pay | Admitting: Nurse Practitioner

## 2019-06-06 NOTE — Progress Notes (Signed)
Cardiology Office Note   Date:  06/07/2019   ID:  Oscar Rodriguez, DOB Sep 11, 1955, MRN [id number]  PCP:  Oscar Heys, MD  Cardiologist: Dr. Anne Fu, MD  Chief Complaint  Patient presents with  . Follow-up    History of Present Illness: Oscar Rodriguez is a 64 y.o. male who presents for 38-month follow-up, seen by Oscar Rodriguez.  Oscar Rodriguez has a hx of CAD s/p CABG 2012 with anginal symptoms including dyspnea on exertion who last underwent LHC on 05/2017  showed patent LIMA filling retrograde up to the point of proximal occlusion, collateralization with distal RCA noted and ostial occlusion of all 3 vein grafts. Medical therapy was recommended.   Imdur was added to his regimen with improvement. Noted to have issues with elevated LFTs for which he has been followed by GI.   He was last seen by myself 03/22/2019 and was stable from a CV standpoint. Continued to have exertional shortness of breath, mildly changed from previous office visit at which time he felt this was in the setting of deconditioning. Reported that since he was last seen in the office by Oscar Rodriguez, he had decreased his physical activity as the gyms are closed in the setting of Covid.    At that time, we discussed increasing his physical activity and following up closely.  May need further work-up with echocardiogram if symptoms worsen or do not improve with increased exercise.  Also reports symptoms of sleep apnea including snoring and daytime fatigue.  Given this, will refer for sleep apnea work-up.    Today Oscar Rodriguez reports that his symptoms have improved. He no longer has complaints of exertional shortness of breath. Has been increasing his exercise tolerance felt to be helping his symptoms. Reports he has been sleeping better lately as well. Appears to have been contacted for sleep study however patient does not recall. He is no longer interested in pursuing sleep study at this time. He denies recurrent anginal  symptoms, PND, orthopnea, dizziness, or syncope. 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 Overall is doing well from a CV standpoint.   Past Medical History:  Diagnosis Date  . Coronary artery disease   . Hyperlipidemia   . Hypertension     Past Surgical History:  Procedure Laterality Date  . CARDIAC SURGERY     by pass  . LEFT HEART CATH AND CORS/GRAFTS ANGIOGRAPHY N/A 06/11/2017   Procedure: LEFT HEART CATH AND CORS/GRAFTS ANGIOGRAPHY;  Surgeon: Oscar Bihari, MD;  [facility]: [facility];  Service: Cardiovascular;  Laterality: N/A;    Current Outpatient Medications  Medication Sig Dispense Refill  . Ascorbic Acid (VITAMIN C) 1000 MG tablet Take 2,000 mg by mouth daily.    Marland Kitchen aspirin 81 MG tablet Take 81 mg by mouth daily.    Marland Kitchen atorvastatin (LIPITOR) 40 MG tablet TAKE 1 TABLET BY MOUTH EVERY DAY AT 6 PM 30 tablet 11  . cetirizine (ZYRTEC) 10 MG tablet Take 10 mg by mouth daily as needed for allergies.    Marland Kitchen GLUCOSAMINE HCL PO Take 1 tablet by mouth daily.     . isosorbide mononitrate (IMDUR) 30 MG 24 hr tablet TAKE 1 TABLET BY MOUTH EVERY DAY 90 tablet 2  . losartan (COZAAR) 50 MG tablet Take 1 tablet (50 mg total) by mouth daily. 30 tablet 11  . metoprolol succinate (TOPROL-XL) 50 MG 24 hr tablet TAKE 1  TABLET BY MOUTH DAILY WITH OR IMMEDIATELY FOLLOWING A MEAL. 30 tablet 11  . Multiple Vitamin (MULTIVITAMIN) tablet Take 1 tablet by mouth daily.    . Omega-3 Fatty Acids (FISH OIL) 1200 MG CAPS Take 2,400 mg by mouth daily.     . vitamin B-12 (CYANOCOBALAMIN) 100 MCG tablet Take 100 mcg by mouth daily.    . vitamin E (VITAMIN E) 400 UNIT capsule Take 400 Units by mouth daily.     No current facility-administered medications for this visit.    Allergies:   Benadryl [diphenhydramine]    Social History:  The patient  reports that he quit smoking about 15 years ago. He started smoking about 25 years ago. He has never  used smokeless tobacco. He reports that he does not drink alcohol or use drugs.   Family History:  The patient's family history includes Dementia in his mother; Heart attack in his maternal uncle.    ROS:  Please see the history of present illness. Otherwise, review of systems are positive for none.   All other systems are reviewed and negative.    PHYSICAL EXAM: VS:  BP 122/76   Pulse 84   Ht 5\' 7"  (1.702 m)   Wt 223 lb 6.4 oz (101.3 kg)   SpO2 96%   BMI 34.99 kg/m  , BMI Body mass index is 34.99 kg/m.   General: Well developed, well nourished, NAD Neck: Negative for carotid bruits. No JVD Lungs:Clear to ausculation bilaterally. No wheezes, rales, or rhonchi. Breathing is unlabored. Cardiovascular: RRR with S1 S2. No murmurs Extremities: No edema. Radial pulses 2+ bilaterally Neuro: Alert and oriented. No focal deficits. No facial asymmetry. MAE spontaneously. Psych: Responds to questions appropriately with normal affect.    EKG:  EKG is not ordered today.  Recent Labs: 03/22/2019: ALT 97; BUN 13; Creatinine, Ser 1.06; Potassium 5.1; Sodium 140    Lipid Panel    Component Value Date/Time   CHOL 143 03/22/2019 0950   TRIG 135 03/22/2019 0950   HDL 39 (L) 03/22/2019 0950   CHOLHDL 3.7 03/22/2019 0950   CHOLHDL 3 07/05/2013 0835   VLDL 14.4 07/05/2013 0835   LDLCALC 80 03/22/2019 0950     Wt Readings from Last 3 Encounters:  06/07/19 223 lb 6.4 oz (101.3 kg)  03/22/19 223 lb 12.8 oz (101.5 kg)  08/25/18 211 lb (95.7 kg)     Other studies Reviewed: Additional studies/ records that were reviewed today include:   Cath 2019: Diagnostic  Dominance: Right   ASSESSMENT AND PLAN:  1. CAD s/p CABG with known occluded vein grafts, patent LAD>LIMA and stable angina: -Continue Imdur -Denies CV symptoms   2. Elevated LFTs: -Folowed by GI -Per Dr. 2020, ok to continue statin therapy given extensive CAD  3. HLD: -Continue statin as above   4. HTN: -Stable,   122/76 -Continue current regimen  5.  Possible sleep apnea: -Reports prior snoring and daytime sleepiness>>> however more recently has been sleeping well therefore has deferred further work-up with sleep study -Was contacted by our sleep team 03/22/2019  6.  Shortness of breath: -Felt likely in the setting of deconditioning  -Reports improvement in symptoms on assessment today  No need for futher work-up at this time   Current medicines are reviewed at length with the patient today.  The patient does not have concerns regarding medicines.  The following changes have been made:  no change  Labs/ tests ordered today include: None  No orders of the defined  types were placed in this encounter.   Disposition:   Rodriguez with Dr. Marlou Porch in 6 months  Signed, Kathyrn Drown, NP  06/07/2019 2:13 PM    [facility] [address], [place], [place]  [address] Phone: [phone]; Fax: [phone]

## 2019-06-07 ENCOUNTER — Encounter: Payer: Self-pay | Admitting: Cardiology

## 2019-06-07 ENCOUNTER — Ambulatory Visit (INDEPENDENT_AMBULATORY_CARE_PROVIDER_SITE_OTHER): Payer: BC Managed Care – PPO | Admitting: Cardiology

## 2019-06-07 ENCOUNTER — Other Ambulatory Visit: Payer: Self-pay

## 2019-06-07 VITALS — BP 122/76 | HR 84 | Ht 67.0 in | Wt 223.4 lb

## 2019-06-07 DIAGNOSIS — I10 Essential (primary) hypertension: Secondary | ICD-10-CM | POA: Diagnosis not present

## 2019-06-07 DIAGNOSIS — I208 Other forms of angina pectoris: Secondary | ICD-10-CM

## 2019-06-07 DIAGNOSIS — I2510 Atherosclerotic heart disease of native coronary artery without angina pectoris: Secondary | ICD-10-CM | POA: Diagnosis not present

## 2019-06-07 DIAGNOSIS — E7800 Pure hypercholesterolemia, unspecified: Secondary | ICD-10-CM

## 2019-06-07 NOTE — Patient Instructions (Signed)
Medication Instructions:   Your physician recommends that you continue on your current medications as directed. Please refer to the Current Medication list given to you today.  *If you need a refill on your cardiac medications before your next appointment, please call your pharmacy*  Lab Work:  None ordered today  If you have labs (blood work) drawn today and your tests are completely normal, you will receive your results only by: Marland Kitchen MyChart Message (if you have MyChart) OR . A paper copy in the mail If you have any lab test that is abnormal or we need to change your treatment, we will call you to review the results.  Testing/Procedures:  None ordered today  Follow-Up: At [facility], you and your health needs are our priority.  As part of our continuing mission to provide you with exceptional heart care, we have created designated Provider Care Teams.  These Care Teams include your primary Cardiologist (physician) and Advanced Practice Providers (APPs -  Physician Assistants and Nurse Practitioners) who all work together to provide you with the care you need, when you need it.  We recommend signing up for the patient portal called "MyChart".  Sign up information is provided on this After Visit Summary.  MyChart is used to connect with patients for Virtual Visits (Telemedicine).  Patients are able to view lab/test results, encounter notes, upcoming appointments, etc.  Non-urgent messages can be sent to your provider as well.   To learn more about what you can do with MyChart, go to [email or web address].    Your next appointment:   6 month(s)  The format for your next appointment:   In Person  Provider:   You may see Donato Schultz, MD or one of the following Advanced Practice Providers on your designated Care Team:    Norma Fredrickson, NP  Nada Boozer, NP  Georgie Chard, NP

## 2019-06-11 DIAGNOSIS — R7303 Prediabetes: Secondary | ICD-10-CM | POA: Diagnosis not present

## 2019-06-11 DIAGNOSIS — E7800 Pure hypercholesterolemia, unspecified: Secondary | ICD-10-CM | POA: Diagnosis not present

## 2019-06-11 DIAGNOSIS — I10 Essential (primary) hypertension: Secondary | ICD-10-CM | POA: Diagnosis not present

## 2019-06-11 DIAGNOSIS — K7581 Nonalcoholic steatohepatitis (NASH): Secondary | ICD-10-CM | POA: Diagnosis not present

## 2019-06-18 ENCOUNTER — Ambulatory Visit: Payer: BC Managed Care – PPO | Attending: Internal Medicine

## 2019-06-18 DIAGNOSIS — Z23 Encounter for immunization: Secondary | ICD-10-CM

## 2019-06-18 NOTE — Progress Notes (Signed)
   Covid-19 Vaccination Clinic  Name:  Oscar Rodriguez    MRN: [id number] DOB: 1955/05/19  06/18/2019  Mr. [name] was observed post Covid-19 immunization for 15 minutes without incident. He was provided with Vaccine Information Sheet and instruction to access the V-Safe system.   [name] was instructed to call 911 with any severe reactions post vaccine: Marland Kitchen Difficulty breathing  . Swelling of face and throat  . A fast heartbeat  . A bad rash all over body  . Dizziness and weakness   Immunizations Administered    Name Date Dose VIS Date Route   Pfizer COVID-19 Vaccine 06/18/2019  8:41 AM 0.3 mL 03/12/2019 Intramuscular   Manufacturer: [employer], [employer]   Lot: [id number]   NDC: [address]-1

## 2019-06-25 ENCOUNTER — Ambulatory Visit: Payer: BC Managed Care – PPO | Admitting: Cardiology

## 2019-06-28 DIAGNOSIS — R7303 Prediabetes: Secondary | ICD-10-CM | POA: Diagnosis not present

## 2019-06-28 DIAGNOSIS — R1031 Right lower quadrant pain: Secondary | ICD-10-CM | POA: Diagnosis not present

## 2019-07-09 ENCOUNTER — Other Ambulatory Visit: Payer: Self-pay | Admitting: Cardiology

## 2019-07-12 DIAGNOSIS — Z713 Dietary counseling and surveillance: Secondary | ICD-10-CM | POA: Diagnosis not present

## 2019-07-13 ENCOUNTER — Ambulatory Visit: Payer: BC Managed Care – PPO | Attending: Internal Medicine

## 2019-07-13 DIAGNOSIS — Z23 Encounter for immunization: Secondary | ICD-10-CM

## 2019-07-13 NOTE — Progress Notes (Signed)
   Covid-19 Vaccination Clinic  Name:  Oscar Rodriguez    MRN: [id number] DOB: January 21, 1956  07/13/2019  [name] was observed post Covid-19 immunization for 15 minutes without incident. He was provided with Vaccine Information Sheet and instruction to access the V-Safe system.   [name] was instructed to call 911 with any severe reactions post vaccine: Marland Kitchen Difficulty breathing  . Swelling of face and throat  . A fast heartbeat  . A bad rash all over body  . Dizziness and weakness   Immunizations Administered    Name Date Dose VIS Date Route   Pfizer COVID-19 Vaccine 07/13/2019  8:48 AM 0.3 mL 03/12/2019 Intramuscular   Manufacturer: [employer], [employer]   Lot: [id number]   NDC: [address]-7

## 2019-09-29 ENCOUNTER — Ambulatory Visit: Payer: Self-pay

## 2019-09-29 ENCOUNTER — Ambulatory Visit (INDEPENDENT_AMBULATORY_CARE_PROVIDER_SITE_OTHER): Payer: BC Managed Care – PPO | Admitting: Orthopedic Surgery

## 2019-09-29 DIAGNOSIS — M25512 Pain in left shoulder: Secondary | ICD-10-CM | POA: Diagnosis not present

## 2019-09-29 DIAGNOSIS — M19012 Primary osteoarthritis, left shoulder: Secondary | ICD-10-CM

## 2019-10-02 ENCOUNTER — Encounter: Payer: Self-pay | Admitting: Orthopedic Surgery

## 2019-10-02 DIAGNOSIS — M19012 Primary osteoarthritis, left shoulder: Secondary | ICD-10-CM | POA: Diagnosis not present

## 2019-10-02 MED ORDER — BUPIVACAINE HCL 0.25 % IJ SOLN
0.6600 mL | INTRAMUSCULAR | Status: AC | PRN
  Administered 2019-10-02: .66 mL via INTRA_ARTICULAR

## 2019-10-02 MED ORDER — METHYLPREDNISOLONE ACETATE 40 MG/ML IJ SUSP
13.3300 mg | INTRAMUSCULAR | Status: AC | PRN
  Administered 2019-10-02: 13.33 mg via INTRA_ARTICULAR

## 2019-10-02 MED ORDER — LIDOCAINE HCL 1 % IJ SOLN
3.0000 mL | INTRAMUSCULAR | Status: AC | PRN
  Administered 2019-10-02: 3 mL

## 2019-10-02 NOTE — Progress Notes (Signed)
Office Visit Note   Patient: Oscar Rodriguez           Date of Birth: 04-Jan-1956           MRN: [id number] Visit Date: 09/29/2019 Requested by: Blair Heys, MD [address] [place],  [place] [address] PCP: Blair Heys, MD  Subjective: Chief Complaint  Patient presents with  . Left Shoulder - Pain    HPI: Oscar Rodriguez is a 64 year old patient with left shoulder pain.  He has had pain for about 3 weeks.  Denies any history of injury.  States that it seems little bit better today.  He is left-hand dominant.  Reports constant pain at times along with pain with range of motion.  Denies any numbness and tingling neck pain or radicular symptoms.  Most of his symptoms are anterior.  Takes Aleve for his symptoms.              ROS: All systems reviewed are negative as they relate to the chief complaint within the history of present illness.  Patient denies  fevers or chills.   Assessment & Plan: Visit Diagnoses:  1. Left shoulder pain, unspecified chronicity   2. Arthritis of left acromioclavicular joint     Plan: Impression is left shoulder pain which looks like AC joint arthritis.  Ultrasound-guided AC joint injection performed today.  We will see how he does with that injection.  If symptoms recur then we can consider repeat injection versus further MRI imaging.  Follow-Up Instructions: Return if symptoms worsen or fail to improve.   Orders:  Orders Placed This Encounter  Procedures  . XR Shoulder Left   No orders of the defined types were placed in this encounter.     Procedures: Medium Joint Inj: L acromioclavicular on 10/02/2019 2:32 PM Indications: pain and diagnostic evaluation Details: 27 G 1.5 in needle, ultrasound-guided superior approach Medications: 13.33 mg methylPREDNISolone acetate 40 MG/ML; 0.66 mL bupivacaine 0.25 %; 3 mL lidocaine 1 % Outcome: tolerated well, no immediate complications Procedure, treatment alternatives, risks and benefits  explained, specific risks discussed. Consent was given by the patient. Immediately prior to procedure a time out was called to verify the correct patient, procedure, equipment, support staff and site/side marked as required. Patient was prepped and draped in the usual sterile fashion.       Clinical Data: No additional findings.  Objective: Vital Signs: There were no vitals taken for this visit.  Physical Exam:   Constitutional: Patient appears well-developed HEENT:  Head: Normocephalic Eyes:EOM are normal Neck: Normal range of motion Cardiovascular: Normal rate Pulmonary/chest: Effort normal Neurologic: Patient is alert Skin: Skin is warm Psychiatric: Patient has normal mood and affect    Ortho Exam: Ortho exam demonstrates good cervical spine range of motion.  No restriction of passive external rotation of the left or right shoulder.  AC joint tenderness present on the left not present on the right.  This is worse with crossarm adduction.  He does have little crepitus in the [facility] joint with passive range of motion on the left-hand side but not the right.  Excellent rotator cuff strength isolated infraspinatus supraspinatus and subscap muscle testing with negative O'Brien's testing no coarse grinding or crepitus with passive range of motion of the shoulder.  Specialty Comments:  No specialty comments available.  Imaging: No results found.   PMFS History: Patient Active Problem List   Diagnosis Date Noted  . Unstable angina (HCC)   . Coronary atherosclerosis of  native coronary artery 07/16/2013  . Obesity 07/16/2013  . Pure hypercholesterolemia 07/16/2013  . Essential hypertension, benign 07/16/2013   Past Medical History:  Diagnosis Date  . Coronary artery disease   . Hyperlipidemia   . Hypertension     Family History  Problem Relation Age of Onset  . Dementia Mother   . Heart attack Maternal Uncle     Past Surgical History:  Procedure Laterality Date  .  CARDIAC SURGERY     by pass  . LEFT HEART CATH AND CORS/GRAFTS ANGIOGRAPHY N/A 06/11/2017   Procedure: LEFT HEART CATH AND CORS/GRAFTS ANGIOGRAPHY;  Surgeon: Lennette Bihari, MD;  Location: [facility];  Service: Cardiovascular;  Laterality: N/A;   Social History   Occupational History  . Not on file  Tobacco Use  . Smoking status: Former Smoker    Start date: 07/16/1993    Quit date: 07/17/2003    Years since quitting: 16.2  . Smokeless tobacco: Never Used  Vaping Use  . Vaping Use: Never used  Substance and Sexual Activity  . Alcohol use: Never  . Drug use: Never  . Sexual activity: Not on file

## 2019-10-08 ENCOUNTER — Encounter: Payer: Self-pay | Admitting: Orthopedic Surgery

## 2019-10-08 NOTE — Telephone Encounter (Signed)
Try topical voltaren otc thx

## 2019-10-12 DIAGNOSIS — Z713 Dietary counseling and surveillance: Secondary | ICD-10-CM | POA: Diagnosis not present

## 2019-12-28 ENCOUNTER — Other Ambulatory Visit: Payer: Self-pay

## 2019-12-28 ENCOUNTER — Ambulatory Visit (INDEPENDENT_AMBULATORY_CARE_PROVIDER_SITE_OTHER): Payer: BC Managed Care – PPO | Admitting: Cardiology

## 2019-12-28 ENCOUNTER — Encounter: Payer: Self-pay | Admitting: Cardiology

## 2019-12-28 VITALS — BP 120/70 | HR 73 | Ht 67.0 in | Wt 212.0 lb

## 2019-12-28 DIAGNOSIS — E7800 Pure hypercholesterolemia, unspecified: Secondary | ICD-10-CM

## 2019-12-28 DIAGNOSIS — R748 Abnormal levels of other serum enzymes: Secondary | ICD-10-CM | POA: Diagnosis not present

## 2019-12-28 DIAGNOSIS — Z79899 Other long term (current) drug therapy: Secondary | ICD-10-CM

## 2019-12-28 DIAGNOSIS — I2510 Atherosclerotic heart disease of native coronary artery without angina pectoris: Secondary | ICD-10-CM | POA: Diagnosis not present

## 2019-12-28 MED ORDER — ROSUVASTATIN CALCIUM 20 MG PO TABS: 20.0000 mg | ORAL_TABLET | Freq: Every day | ORAL | 3 refills | Status: DC

## 2019-12-28 NOTE — Progress Notes (Signed)
Cardiology Office Note:    Date:  12/29/2019   ID:  Oscar Rodriguez, DOB [birth year], MRN [id number]  PCP:  Blair Heys, MD  [facility] Cardiologist:  Donato Schultz, MD  [facility] Electrophysiologist:  None   Referring MD: Blair Heys, MD     History of Present Illness:    Oscar Rodriguez is a 64 y.o. male with coronary disease here for follow-up.  Prior CABG 2012.  Left heart cath March 2019 showed patent LIMA with retrograde filling up to the point of proximal occlusion and collateralization with distal RCA noted and ostial occlusion of all 3 vein grafts.  Medical therapy.  Took isosorbide and felt better.  Had elevated LFTs followed by GI at one point.  [address], [address], [place].   Previously had complaints of exertional shortness of breath.  Previously not interested in sleep study.   Past Medical History:  Diagnosis Date  . Coronary artery disease   . Hyperlipidemia   . Hypertension     Past Surgical History:  Procedure Laterality Date  . CARDIAC SURGERY     by pass  . LEFT HEART CATH AND CORS/GRAFTS ANGIOGRAPHY N/A 06/11/2017   Procedure: LEFT HEART CATH AND CORS/GRAFTS ANGIOGRAPHY;  Surgeon: Lennette Bihari, MD;  Location: [facility];  Service: Cardiovascular;  Laterality: N/A;    Current Medications: Current Meds  Medication Sig  . Ascorbic Acid (VITAMIN C) 1000 MG tablet Take 2,000 mg by mouth daily.  Marland Kitchen aspirin 81 MG tablet Take 81 mg by mouth daily.  . cetirizine (ZYRTEC) 10 MG tablet Take 10 mg by mouth daily as needed for allergies.  Marland Kitchen GLUCOSAMINE HCL PO Take 1 tablet by mouth daily.   . isosorbide mononitrate (IMDUR) 30 MG 24 hr tablet TAKE 1 TABLET BY MOUTH EVERY DAY  . losartan (COZAAR) 50 MG tablet Take 1 tablet (50 mg total) by mouth daily.  . metoprolol succinate (TOPROL-XL) 50 MG 24 hr tablet TAKE 1 TABLET BY MOUTH DAILY WITH OR IMMEDIATELY FOLLOWING A MEAL.  . Multiple Vitamin (MULTIVITAMIN) tablet Take 1 tablet by  mouth daily.  . Omega-3 Fatty Acids (FISH OIL) 1200 MG CAPS Take 2,400 mg by mouth daily.   . vitamin B-12 (CYANOCOBALAMIN) 100 MCG tablet Take 100 mcg by mouth daily.  . vitamin E (VITAMIN E) 400 UNIT capsule Take 400 Units by mouth daily.  . [DISCONTINUED] atorvastatin (LIPITOR) 40 MG tablet TAKE 1 TABLET BY MOUTH EVERY DAY AT 6 PM     Allergies:   Benadryl [diphenhydramine]   Social History   Socioeconomic History  . Marital status: Single    Spouse name: Not on file  . Number of children: Not on file  . Years of education: Not on file  . Highest education level: Not on file  Occupational History  . Not on file  Tobacco Use  . Smoking status: Former Smoker    Start date: 07/16/1993    Quit date: 07/17/2003    Years since quitting: 16.4  . Smokeless tobacco: Never Used  Vaping Use  . Vaping Use: Never used  Substance and Sexual Activity  . Alcohol use: Never  . Drug use: Never  . Sexual activity: Not on file  Other Topics Concern  . Not on file  Social History Narrative  . Not on file   Social Determinants of Health   Financial Resource Strain:   . Difficulty of Paying Living Expenses: Not on file  Food Insecurity:   .  Worried About Programme researcher, broadcasting/film/video in the Last Year: Not on file  . Ran Out of Food in the Last Year: Not on file  Transportation Needs:   . Lack of Transportation (Medical): Not on file  . Lack of Transportation (Non-Medical): Not on file  Physical Activity:   . Days of Exercise per Week: Not on file  . Minutes of Exercise per Session: Not on file  Stress:   . Feeling of Stress : Not on file  Social Connections:   . Frequency of Communication with Friends and Family: Not on file  . Frequency of Social Gatherings with Friends and Family: Not on file  . Attends Religious Services: Not on file  . Active Member of Clubs or Organizations: Not on file  . Attends Banker Meetings: Not on file  . Marital Status: Not on file     Family  History: The patient's family history includes Dementia in his mother; Heart attack in his maternal uncle.  ROS:   Please see the history of present illness.     All other systems reviewed and are negative.  EKGs/Labs/Other Studies Reviewed:    The following studies were reviewed today:  Diagnostic CATH 2019 Dominance: Right    EKG:  EKG is  ordered today.  The ekg ordered today demonstrates SR 73 no changes  Recent Labs: 03/22/2019: ALT 97; BUN 13; Creatinine, Ser 1.06; Potassium 5.1; Sodium 140  Recent Lipid Panel    Component Value Date/Time   CHOL 143 03/22/2019 0950   TRIG 135 03/22/2019 0950   HDL 39 (L) 03/22/2019 0950   CHOLHDL 3.7 03/22/2019 0950   CHOLHDL 3 07/05/2013 0835   VLDL 14.4 07/05/2013 0835   LDLCALC 80 03/22/2019 0950    Physical Exam:    VS:  BP 120/70   Pulse 73   Ht 5\' 7"  (1.702 m)   Wt 212 lb (96.2 kg)   SpO2 96%   BMI 33.20 kg/m     Wt Readings from Last 3 Encounters:  12/28/19 212 lb (96.2 kg)  06/07/19 223 lb 6.4 oz (101.3 kg)  03/22/19 223 lb 12.8 oz (101.5 kg)     GEN:  Well nourished, well developed in no acute distress HEENT: Normal NECK: No JVD; No carotid bruits LYMPHATICS: No lymphadenopathy CARDIAC: RRR, no murmurs, rubs, gallops, scar RESPIRATORY:  Clear to auscultation without rales, wheezing or rhonchi  ABDOMEN: Soft, non-tender, non-distended MUSCULOSKELETAL:  No edema; No deformity  SKIN: Warm and dry NEUROLOGIC:  Alert and oriented x 3 PSYCHIATRIC:  Normal affect   ASSESSMENT:    1. Coronary artery disease involving native coronary artery of native heart without angina pectoris   2. Pure hypercholesterolemia   3. Elevated liver enzymes   4. Medication management    PLAN:    In order of problems listed above:  Coronary disease status post CABG -Occluded vein grafts -Patent LIMA to LAD. -Doing very well with isosorbide.  Metoprolol.  No changes made.  Occasional shortness of breath.  May be  deconditioning.  Continue with exercise.  Elevated LFTs -97 ALT.  GI has evaluated.  Hyperlipidemia -LDL 80.  I am going to change him over from atorvastatin 40 to Crestor 20.  Respectful of ALT as well.  Recheck in 3 months.     Medication Adjustments/Labs and Tests Ordered: Current medicines are reviewed at length with the patient today.  Concerns regarding medicines are outlined above.  Orders Placed This Encounter  Procedures  .  ALT  . Lipid panel  . EKG 12-Lead   Meds ordered this encounter  Medications  . rosuvastatin (CRESTOR) 20 MG tablet    Sig: Take 1 tablet (20 mg total) by mouth daily.    Dispense:  90 tablet    Refill:  3    Patient Instructions  Medication Instructions:  Please discontinue your Atorvastatin and start Crestor 20 mg daily. Continue all other medications as listed.  *If you need a refill on your cardiac medications before your next appointment, please call your pharmacy*  Lab Work: Please have blood work in 3 months (Lipid/ALT)  If you have labs (blood work) drawn today and your tests are completely normal, you will receive your results only by: Marland Kitchen MyChart Message (if you have MyChart) OR . A paper copy in the mail If you have any lab test that is abnormal or we need to change your treatment, we will call you to review the results.  Follow-Up: At [facility], you and your health needs are our priority.  As part of our continuing mission to provide you with exceptional heart care, we have created designated Provider Care Teams.  These Care Teams include your primary Cardiologist (physician) and Advanced Practice Providers (APPs -  Physician Assistants and Nurse Practitioners) who all work together to provide you with the care you need, when you need it.  We recommend signing up for the patient portal called "MyChart".  Sign up information is provided on this After Visit Summary.  MyChart is used to connect with patients for Virtual Visits  (Telemedicine).  Patients are able to view lab/test results, encounter notes, upcoming appointments, etc.  Non-urgent messages can be sent to your provider as well.   To learn more about what you can do with MyChart, go to [email or web address].    Your next appointment:   6 month(s)  The format for your next appointment:   In Person  Provider:   Donato Schultz, MD   Thank you for choosing [facility]!!        Signed, Donato Schultz, MD  12/29/2019 6:37 AM    [facility]

## 2019-12-28 NOTE — Patient Instructions (Signed)
Medication Instructions:  Please discontinue your Atorvastatin and start Crestor 20 mg daily. Continue all other medications as listed.  *If you need a refill on your cardiac medications before your next appointment, please call your pharmacy*  Lab Work: Please have blood work in 3 months (Lipid/ALT)  If you have labs (blood work) drawn today and your tests are completely normal, you will receive your results only by: Marland Kitchen MyChart Message (if you have MyChart) OR . A paper copy in the mail If you have any lab test that is abnormal or we need to change your treatment, we will call you to review the results.  Follow-Up: At [facility], you and your health needs are our priority.  As part of our continuing mission to provide you with exceptional heart care, we have created designated Provider Care Teams.  These Care Teams include your primary Cardiologist (physician) and Advanced Practice Providers (APPs -  Physician Assistants and Nurse Practitioners) who all work together to provide you with the care you need, when you need it.  We recommend signing up for the patient portal called "MyChart".  Sign up information is provided on this After Visit Summary.  MyChart is used to connect with patients for Virtual Visits (Telemedicine).  Patients are able to view lab/test results, encounter notes, upcoming appointments, etc.  Non-urgent messages can be sent to your provider as well.   To learn more about what you can do with MyChart, go to [email or web address].    Your next appointment:   6 month(s)  The format for your next appointment:   In Person  Provider:   Donato Schultz, MD   Thank you for choosing [facility]!!

## 2019-12-31 DIAGNOSIS — E7800 Pure hypercholesterolemia, unspecified: Secondary | ICD-10-CM | POA: Diagnosis not present

## 2019-12-31 DIAGNOSIS — I10 Essential (primary) hypertension: Secondary | ICD-10-CM | POA: Diagnosis not present

## 2019-12-31 DIAGNOSIS — R7303 Prediabetes: Secondary | ICD-10-CM | POA: Diagnosis not present

## 2019-12-31 DIAGNOSIS — K7581 Nonalcoholic steatohepatitis (NASH): Secondary | ICD-10-CM | POA: Diagnosis not present

## 2020-03-09 DIAGNOSIS — D489 Neoplasm of uncertain behavior, unspecified: Secondary | ICD-10-CM | POA: Diagnosis not present

## 2020-03-29 ENCOUNTER — Other Ambulatory Visit: Payer: BC Managed Care – PPO

## 2020-04-07 ENCOUNTER — Other Ambulatory Visit: Payer: BC Managed Care – PPO | Admitting: *Deleted

## 2020-04-07 ENCOUNTER — Other Ambulatory Visit: Payer: Self-pay

## 2020-04-07 DIAGNOSIS — E7800 Pure hypercholesterolemia, unspecified: Secondary | ICD-10-CM

## 2020-04-07 DIAGNOSIS — Z79899 Other long term (current) drug therapy: Secondary | ICD-10-CM | POA: Diagnosis not present

## 2020-04-08 LAB — ALT: ALT: 74 IU/L — ABNORMAL HIGH (ref 0–44)

## 2020-04-08 LAB — LIPID PANEL
Chol/HDL Ratio: 4.2 ratio (ref 0.0–5.0)
Cholesterol, Total: 166 mg/dL (ref 100–199)
HDL: 40 mg/dL (ref >39)
LDL Chol Calc (NIH): 97 mg/dL (ref 0–99)
Triglycerides: 168 mg/dL — ABNORMAL HIGH (ref 0–149)
VLDL Cholesterol Cal: 29 mg/dL (ref 5–40)

## 2020-04-11 ENCOUNTER — Telehealth: Payer: Self-pay | Admitting: Cardiology

## 2020-04-11 DIAGNOSIS — E7800 Pure hypercholesterolemia, unspecified: Secondary | ICD-10-CM

## 2020-04-11 DIAGNOSIS — Z79899 Other long term (current) drug therapy: Secondary | ICD-10-CM

## 2020-04-11 MED ORDER — EZETIMIBE 10 MG PO TABS: 10.0000 mg | ORAL_TABLET | Freq: Every day | ORAL | 3 refills | Status: DC

## 2020-04-11 NOTE — Telephone Encounter (Signed)
LDL 97 on Crestor 20 mg.  ALT 79 mildly elevated but stable from 9 years ago.   Lets add Zetia 10 mg once a day and see if we can drop his LDL to less than 70 given his prior bypass   Repeat lipid panel and ALT in 3 months   Donato Schultz, MD

## 2020-04-11 NOTE — Telephone Encounter (Signed)
Pt is aware - rx sent to [facility] [place] - 3 month lab scheduled for 07/14/2020.

## 2020-04-11 NOTE — Telephone Encounter (Signed)
Pt c/o medication issue:  1. Name of Medication: zetia 10 mg 1 tablet daily  2. How are you currently taking this medication (dosage and times per day)? Has not started  3. Are you having a reaction (difficulty breathing--STAT)? no  4. What is your medication issue? Patient states Dr. Anne Fu wants him to start the medication. He would like to know if it will be sent to his pharmacy.

## 2020-06-30 DIAGNOSIS — R7303 Prediabetes: Secondary | ICD-10-CM | POA: Diagnosis not present

## 2020-06-30 DIAGNOSIS — E7800 Pure hypercholesterolemia, unspecified: Secondary | ICD-10-CM | POA: Diagnosis not present

## 2020-06-30 DIAGNOSIS — Z86010 Personal history of colonic polyps: Secondary | ICD-10-CM | POA: Diagnosis not present

## 2020-06-30 DIAGNOSIS — Z125 Encounter for screening for malignant neoplasm of prostate: Secondary | ICD-10-CM | POA: Diagnosis not present

## 2020-06-30 DIAGNOSIS — I10 Essential (primary) hypertension: Secondary | ICD-10-CM | POA: Diagnosis not present

## 2020-07-12 ENCOUNTER — Ambulatory Visit (INDEPENDENT_AMBULATORY_CARE_PROVIDER_SITE_OTHER): Payer: BC Managed Care – PPO | Admitting: Orthopedic Surgery

## 2020-07-12 DIAGNOSIS — M7552 Bursitis of left shoulder: Secondary | ICD-10-CM | POA: Diagnosis not present

## 2020-07-14 ENCOUNTER — Other Ambulatory Visit: Payer: BC Managed Care – PPO | Admitting: *Deleted

## 2020-07-14 ENCOUNTER — Other Ambulatory Visit: Payer: Self-pay

## 2020-07-14 DIAGNOSIS — E7800 Pure hypercholesterolemia, unspecified: Secondary | ICD-10-CM | POA: Diagnosis not present

## 2020-07-14 DIAGNOSIS — Z79899 Other long term (current) drug therapy: Secondary | ICD-10-CM | POA: Diagnosis not present

## 2020-07-14 LAB — LIPID PANEL
Chol/HDL Ratio: 2.5 ratio (ref 0.0–5.0)
Cholesterol, Total: 140 mg/dL (ref 100–199)
HDL: 55 mg/dL (ref >39)
LDL Chol Calc (NIH): 68 mg/dL (ref 0–99)
Triglycerides: 89 mg/dL (ref 0–149)
VLDL Cholesterol Cal: 17 mg/dL (ref 5–40)

## 2020-07-14 LAB — ALT: ALT: 92 IU/L — ABNORMAL HIGH (ref 0–44)

## 2020-07-15 ENCOUNTER — Encounter: Payer: Self-pay | Admitting: Orthopedic Surgery

## 2020-07-15 MED ORDER — LIDOCAINE HCL 1 % IJ SOLN
5.0000 mL | INTRAMUSCULAR | Status: AC | PRN
  Administered 2020-07-12: 5 mL

## 2020-07-15 MED ORDER — BETAMETHASONE SOD PHOS & ACET 6 (3-3) MG/ML IJ SUSP
6.0000 mg | INTRAMUSCULAR | Status: AC | PRN
  Administered 2020-07-12: 6 mg via INTRA_ARTICULAR

## 2020-07-15 MED ORDER — BUPIVACAINE HCL 0.5 % IJ SOLN
9.0000 mL | INTRAMUSCULAR | Status: AC | PRN
  Administered 2020-07-12: 9 mL via INTRA_ARTICULAR

## 2020-07-15 NOTE — Progress Notes (Signed)
Office Visit Note   Patient: Oscar Rodriguez           Date of Birth: [birth year]           MRN: [id number] Visit Date: 07/12/2020 Requested by: Blair Heys, MD [address] [place],  [place] [address] PCP: Blair Heys, MD  Subjective: Chief Complaint  Patient presents with  . Left Shoulder - Pain    HPI: Oscar Rodriguez is a 65 year old patient with left shoulder pain.  Is been going on for about 4 weeks.  Worsening pain.  Reports some occasional radiation but no numbness and tingling.  Describes a little bit of axial neck pain.  Wakes him from sleep at night if he rolls on the left-hand side.  Reports some subjective decrease in range of motion due to discomfort.  Had left shoulder AC joint injection 09/29/2019 which helped.  Hard for him to lift things.  Trying to avoid surgery on the left side.  Pain is predominantly lateral in the left shoulder and not superior.  Feels like this is a different location than the pain he had the injection for a year ago.  Denies any mechanical symptoms.  He has been working out doing chest press lateral pulldown and triceps press.  He works for [employer]              ROS: All systems reviewed are negative as they relate to the chief complaint within the history of present illness.  Patient denies  fevers or chills.   Assessment & Plan: Visit Diagnoses:  1. Bursitis of left shoulder     Plan: Impression is left shoulder pain with probable bursitis.  Does not really look like it is the [facility] joint today.  Plan for subacromial injection in 6-week return to decide for or against MRI scanning at that time.  Does not really look like frozen shoulder.  Follow-up in 6 weeks for clinical recheck to decide for or against MRI scanning.  Follow-Up Instructions: Return in about 6 weeks (around 08/23/2020).   Orders:  No orders of the defined types were placed in this encounter.  No orders of the defined types were placed in this encounter.      Procedures: Large Joint Inj: L subacromial bursa on 07/12/2020 11:52 AM Indications: diagnostic evaluation and pain Details: 18 G 1.5 in needle, posterior approach  Arthrogram: No  Medications: 9 mL bupivacaine 0.5 %; 5 mL lidocaine 1 %; 6 mg betamethasone acetate-betamethasone sodium phosphate 6 (3-3) MG/ML Outcome: tolerated well, no immediate complications Procedure, treatment alternatives, risks and benefits explained, specific risks discussed. Consent was given by the patient. Immediately prior to procedure a time out was called to verify the correct patient, procedure, equipment, support staff and site/side marked as required. Patient was prepped and draped in the usual sterile fashion.       Clinical Data: No additional findings.  Objective: Vital Signs: There were no vitals taken for this visit.  Physical Exam:   Constitutional: Patient appears well-developed HEENT:  Head: Normocephalic Eyes:EOM are normal Neck: Normal range of motion Cardiovascular: Normal rate Pulmonary/chest: Effort normal Neurologic: Patient is alert Skin: Skin is warm Psychiatric: Patient has normal mood and affect    Ortho Exam: Ortho exam demonstrates good cervical spine range of motion.  5 out of 5 grip EPL FPL interosseous wrist flexion wrist extension biceps triceps and deltoid strength.  Mild tenderness to palpation AC joint left not on the right.  Shoulder range of  motion passively on the left 35/90/150.  On the right 35/100/160.  Rotator cuff strength is intact infraspinatus supraspinatus subscap muscle testing.  Negative O'Brien's testing bilaterally.  No coarse grinding or crepitus present in the shoulder girdle region.  Specialty Comments:  No specialty comments available.  Imaging: No results found.   PMFS History: Patient Active Problem List   Diagnosis Date Noted  . Unstable angina (HCC)   . Coronary atherosclerosis of native coronary artery 07/16/2013  . Obesity 07/16/2013   . Pure hypercholesterolemia 07/16/2013  . Essential hypertension, benign 07/16/2013   Past Medical History:  Diagnosis Date  . Coronary artery disease   . Hyperlipidemia   . Hypertension     Family History  Problem Relation Age of Onset  . Dementia Mother   . Heart attack Maternal Uncle     Past Surgical History:  Procedure Laterality Date  . CARDIAC SURGERY     by pass  . LEFT HEART CATH AND CORS/GRAFTS ANGIOGRAPHY N/A 06/11/2017   Procedure: LEFT HEART CATH AND CORS/GRAFTS ANGIOGRAPHY;  Surgeon: Lennette Bihari, MD;  Location: [facility];  Service: Cardiovascular;  Laterality: N/A;   Social History   Occupational History  . Not on file  Tobacco Use  . Smoking status: Former Smoker    Start date: 07/16/1993    Quit date: 07/17/2003    Years since quitting: 17.0  . Smokeless tobacco: Never Used  Vaping Use  . Vaping Use: Never used  Substance and Sexual Activity  . Alcohol use: Never  . Drug use: Never  . Sexual activity: Not on file

## 2020-07-23 ENCOUNTER — Other Ambulatory Visit: Payer: Self-pay | Admitting: Cardiology

## 2020-08-01 ENCOUNTER — Other Ambulatory Visit: Payer: Self-pay

## 2020-08-01 MED ORDER — ROSUVASTATIN CALCIUM 20 MG PO TABS: 20.0000 mg | ORAL_TABLET | Freq: Every day | ORAL | 1 refills | Status: DC

## 2020-08-01 MED ORDER — ISOSORBIDE MONONITRATE ER 30 MG PO TB24: 30.0000 mg | ORAL_TABLET | Freq: Every day | ORAL | 1 refills | Status: DC

## 2020-08-01 MED ORDER — LOSARTAN POTASSIUM 50 MG PO TABS: 50.0000 mg | ORAL_TABLET | Freq: Every day | ORAL | 1 refills | Status: DC

## 2020-08-01 MED ORDER — EZETIMIBE 10 MG PO TABS: 10.0000 mg | ORAL_TABLET | Freq: Every day | ORAL | 1 refills | Status: DC

## 2020-08-01 MED ORDER — METOPROLOL SUCCINATE ER 50 MG PO TB24: 50.0000 mg | ORAL_TABLET | Freq: Every day | ORAL | 1 refills | Status: DC

## 2020-12-06 ENCOUNTER — Other Ambulatory Visit: Payer: Self-pay | Admitting: Cardiology

## 2021-01-02 ENCOUNTER — Telehealth: Payer: Self-pay | Admitting: Cardiology

## 2021-01-02 MED ORDER — ISOSORBIDE MONONITRATE ER 30 MG PO TB24: 30.0000 mg | ORAL_TABLET | Freq: Every day | ORAL | 0 refills | Status: DC

## 2021-01-02 NOTE — Telephone Encounter (Signed)
Pt scheduled to see Dr. Anne Fu, 04/2021.  90 day supply of Imdur was sent to [facility].

## 2021-01-02 NOTE — Telephone Encounter (Signed)
 *  STAT* If patient is at the pharmacy, call can be transferred to refill team.   1. Which medications need to be refilled? (please list name of each medication and dose if known)   isosorbide mononitrate (IMDUR) 30 MG 24 hr tablet  2. Which pharmacy/location (including street and city if local pharmacy) is medication to be sent to?  [phone] - [place], [place] - [address]  3. Do they need a 30 day or 90 day supply? 90  Patient has appointment with Dr Anne Fu on 04/30/21

## 2021-03-26 ENCOUNTER — Encounter: Payer: Self-pay | Admitting: Cardiology

## 2021-03-27 MED ORDER — ROSUVASTATIN CALCIUM 20 MG PO TABS: 20.0000 mg | ORAL_TABLET | Freq: Every day | ORAL | 1 refills | Status: DC

## 2021-04-26 ENCOUNTER — Encounter: Payer: Self-pay | Admitting: Cardiology

## 2021-04-26 ENCOUNTER — Other Ambulatory Visit: Payer: Self-pay

## 2021-04-26 ENCOUNTER — Ambulatory Visit (INDEPENDENT_AMBULATORY_CARE_PROVIDER_SITE_OTHER): Payer: Self-pay | Admitting: Cardiology

## 2021-04-26 DIAGNOSIS — E7800 Pure hypercholesterolemia, unspecified: Secondary | ICD-10-CM

## 2021-04-26 DIAGNOSIS — I25709 Atherosclerosis of coronary artery bypass graft(s), unspecified, with unspecified angina pectoris: Secondary | ICD-10-CM

## 2021-04-26 DIAGNOSIS — I10 Essential (primary) hypertension: Secondary | ICD-10-CM

## 2021-04-26 MED ORDER — LOSARTAN POTASSIUM 50 MG PO TABS: 50.0000 mg | ORAL_TABLET | Freq: Every day | ORAL | 3 refills | Status: DC

## 2021-04-26 MED ORDER — ISOSORBIDE MONONITRATE ER 30 MG PO TB24: 30.0000 mg | ORAL_TABLET | Freq: Every day | ORAL | 3 refills | Status: DC

## 2021-04-26 MED ORDER — ROSUVASTATIN CALCIUM 20 MG PO TABS: 20.0000 mg | ORAL_TABLET | Freq: Every day | ORAL | 3 refills | Status: DC

## 2021-04-26 MED ORDER — METOPROLOL SUCCINATE ER 50 MG PO TB24: 50.0000 mg | ORAL_TABLET | Freq: Every day | ORAL | 3 refills | Status: AC

## 2021-04-26 MED ORDER — EZETIMIBE 10 MG PO TABS: 10.0000 mg | ORAL_TABLET | Freq: Every day | ORAL | 3 refills | Status: DC

## 2021-04-26 NOTE — Assessment & Plan Note (Addendum)
Prior visit changed him from atorvastatin over to Crestor 20.  GI has evaluated for elevated LFTs, 97 ALT at 1 point.  Doing very well with Zetia as well as Crestor 20 mg.  His LDL was 47.  Goal less than 50.  Wonderful.  No myalgias.

## 2021-04-26 NOTE — Assessment & Plan Note (Addendum)
Overall stable without any anginal symptoms.  Continuing with aspirin losartan metoprolol Crestor Zetia.  Excellent goal-directed medical therapy.  On isosorbide.  Excellent.

## 2021-04-26 NOTE — Patient Instructions (Signed)
Medication Instructions:  Continue all medications as listed.  *If you need a refill on your cardiac medications before your next appointment, please call your pharmacy*  Follow-Up: At [facility], you and your health needs are our priority.  As part of our continuing mission to provide you with exceptional heart care, we have created designated Provider Care Teams.  These Care Teams include your primary Cardiologist (physician) and Advanced Practice Providers (APPs -  Physician Assistants and Nurse Practitioners) who all work together to provide you with the care you need, when you need it.  We recommend signing up for the patient portal called "MyChart".  Sign up information is provided on this After Visit Summary.  MyChart is used to connect with patients for Virtual Visits (Telemedicine).  Patients are able to view lab/test results, encounter notes, upcoming appointments, etc.  Non-urgent messages can be sent to your provider as well.   To learn more about what you can do with MyChart, go to [email or web address].    Your next appointment:   1 year(s)  The format for your next appointment:   In Person  Provider:   Donato Schultz, MD    Thank you for choosing [facility]!!

## 2021-04-26 NOTE — Progress Notes (Signed)
Cardiology Office Note:    Date:  04/26/2021   ID:  Oscar Rodriguez, DOB 13-Feb-1956, MRN [id number]  PCP:  Gaynelle Arabian, MD  [facility] Cardiologist:  Candee Furbish, MD  [facility] Electrophysiologist:  None   Referring MD: Gaynelle Arabian, MD    History of Present Illness:    Oscar Rodriguez is a 66 y.o. male here for the follow-up of coronary artery disease, hypertension, and hyperlipidemia.  Prior CABG 2012.  Left heart cath March 2019 showed patent LIMA with retrograde filling up to the point of proximal occlusion and collateralization with distal RCA noted and ostial occlusion of all 3 vein grafts.  Medical therapy.  Took isosorbide and felt better.  Had elevated LFTs followed by GI at one point.  [place], [place], [place].   Previously had complaints of exertional shortness of breath.  Previously not interested in sleep study.  Today: Overall, he has been feeling well. Depending on his activity he has a little shortness of breath. Walking from the parking lot to the second floor of the gym will cause him to be winded. Occasionally he has a little chest pain as well, but this resolves after getting water and beginning his routine.  His most recent lipids showed an LDL of 47.  He denies any palpitations. No lightheadedness, headaches, syncope, orthopnea, PND, or lower extremity edema.   Past Medical History:  Diagnosis Date   Coronary artery disease    Hyperlipidemia    Hypertension     Past Surgical History:  Procedure Laterality Date   CARDIAC SURGERY     by pass   LEFT HEART CATH AND CORS/GRAFTS ANGIOGRAPHY N/A 06/11/2017   Procedure: LEFT HEART CATH AND CORS/GRAFTS ANGIOGRAPHY;  Surgeon: Troy Sine, MD;  Location: [facility];  Service: Cardiovascular;  Laterality: N/A;    Current Medications: Current Meds  Medication Sig   Ascorbic Acid (VITAMIN C) 1000 MG tablet Take 2,000 mg by mouth daily.   aspirin 81 MG tablet Take 81 mg  by mouth daily.   cetirizine (ZYRTEC) 10 MG tablet Take 10 mg by mouth daily as needed for allergies.   GLUCOSAMINE HCL PO Take 1 tablet by mouth daily.    Multiple Vitamin (MULTIVITAMIN) tablet Take 1 tablet by mouth daily.   Omega-3 Fatty Acids (FISH OIL) 1200 MG CAPS Take 2,400 mg by mouth daily.    vitamin B-12 (CYANOCOBALAMIN) 100 MCG tablet Take 100 mcg by mouth daily.   vitamin E 180 MG (400 UNITS) capsule Take 400 Units by mouth daily.   [DISCONTINUED] ezetimibe (ZETIA) 10 MG tablet Take 1 tablet (10 mg total) by mouth daily.   [DISCONTINUED] isosorbide mononitrate (IMDUR) 30 MG 24 hr tablet Take 1 tablet (30 mg total) by mouth daily. Please make overdue appt with Dr. Marlou Porch before anymore refills. Thank you 1st attempt   [DISCONTINUED] losartan (COZAAR) 50 MG tablet Take 1 tablet (50 mg total) by mouth daily.   [DISCONTINUED] metoprolol succinate (TOPROL-XL) 50 MG 24 hr tablet Take 1 tablet (50 mg total) by mouth daily. Take with or immediately following a meal.   [DISCONTINUED] rosuvastatin (CRESTOR) 20 MG tablet Take 1 tablet (20 mg total) by mouth daily. Please make overdue appt with Dr. Marlou Porch before anymore refills. Thank you 1st attempt     Allergies:   Benadryl [diphenhydramine]   Social History   Socioeconomic History   Marital status: Single    Spouse name: Not on file   Number of  children: Not on file   Years of education: Not on file   Highest education level: Not on file  Occupational History   Not on file  Tobacco Use   Smoking status: Former    Types: Cigarettes    Start date: 07/16/1993    Quit date: 07/17/2003    Years since quitting: 17.7   Smokeless tobacco: Never  Vaping Use   Vaping Use: Never used  Substance and Sexual Activity   Alcohol use: Never   Drug use: Never   Sexual activity: Not on file  Other Topics Concern   Not on file  Social History Narrative   Not on file   Social Determinants of Health   Financial Resource Strain: Not on file   Food Insecurity: Not on file  Transportation Needs: Not on file  Physical Activity: Not on file  Stress: Not on file  Social Connections: Not on file     Family History: The patient's family history includes Dementia in his mother; Heart attack in his maternal uncle.  ROS:   Please see the history of present illness.    (+) Shortness of breath (+) Chest pain All other systems reviewed and are negative.  EKGs/Labs/Other Studies Reviewed:    The following studies were reviewed today:  Diagnostic Left Heart CATH 05/2017: Prox RCA to Mid RCA lesion is 100% stenosed. Ost 2nd Mrg lesion is 100% stenosed. Ost LAD to Prox LAD lesion is 95% stenosed. Prox LAD lesion is 100% stenosed. Origin lesion is 100% stenosed. Origin lesion is 100% stenosed. Origin lesion is 100% stenosed. LV end diastolic pressure is mildly elevated. The left ventricular systolic function is normal. There is no mitral valve regurgitation.   Low normal global LV function with an ejection fraction estimate of 50% and mid-distal mild inferior hypocontractility.   Significant native CAD with evidence for coronary calcification involving all 3 native coronary arteries.   There was a 90% calcified very proximal LAD stenosis before the first diagonal vessel and the LAD was then occluded after this diagonal vessel.   The left circumflex coronary artery had total occlusion of its prominent marginal vessel, but there was evidence for bridging antegrade collaterals in a short segment after the occlusion as well as retrograde collaterals supplying this marginal vessel which bifurcated distally.   Total occlusion of the proximal RCA with bridging antegrade collaterals` and significant left to right distal collaterals from both the distal circumflex and LIMA to LAD.   Patent left internal mammary artery graft supplying the LAD which fills retrograde up to the point of proximal occlusion extends beyond the apex distally,  which collateralizes the distal RCA.   Ostial occlusion of all 3 saphenous vein grafts  which had previously supplied the diagonal vessel, the marginal vessel, and the PDA branch of the RCA.   RECOMMENDATION: Recommend initial increased medical therapy trial with addition of isosorbide mononitrate to his current regimen.  Consider future addition of amlodipine or possible ranolazine.  If continued symptoms persist, consider attempt at intervention to the circumflex marginal vessel.  Dominance: Right     Nuclear Stress Test 05/2017: Nuclear stress EF: 53%. Blood pressure demonstrated a hypotensive response to exercise. ST segment depression was noted during stress. Defect 1: There is a large defect of severe severity present in the basal inferior, basal inferolateral, mid inferior, mid inferolateral and apex location. This is a high risk study. Findings consistent with ischemia. The left ventricular ejection fraction is mildly decreased (45-54%).   Abnormal,  high risk stress nuclear study with chest pain, possible hypotensive response, ECG changes and severe ischemia in the inferior and inferolateral walls; EF 53 with hypokinesis of the inferior wall. Pt seen by Dr Marlou Porch and scheduled for catheterization.  EKG:  EKG is personally reviewed and interpreted. 04/26/2021: Sinus rhythm. Rate 75 bpm. 12/28/2019: SR 73 no changes  Recent Labs: 07/14/2020: ALT 92   Recent Lipid Panel    Component Value Date/Time   CHOL 140 07/14/2020 0823   TRIG 89 07/14/2020 0823   HDL 55 07/14/2020 0823   CHOLHDL 2.5 07/14/2020 0823   CHOLHDL 3 07/05/2013 0835   VLDL 14.4 07/05/2013 0835   LDLCALC 68 07/14/2020 0823    Physical Exam:    VS:  BP 100/60 (BP Location: Left Arm, Patient Position: Sitting, Cuff Size: Normal)    Pulse 75    Ht 5\' 7"  (1.702 m)    Wt 216 lb (98 kg)    SpO2 96%    BMI 33.83 kg/m     Wt Readings from Last 3 Encounters:  04/26/21 216 lb (98 kg)  12/28/19 212 lb (96.2 kg)   06/07/19 223 lb 6.4 oz (101.3 kg)     GEN:  Well nourished, well developed in no acute distress HEENT: Normal NECK: No JVD; No carotid bruits LYMPHATICS: No lymphadenopathy CARDIAC: RRR, no murmurs, rubs, gallops, scar RESPIRATORY:  Clear to auscultation without rales, wheezing or rhonchi  ABDOMEN: Soft, non-tender, non-distended MUSCULOSKELETAL:  No edema; No deformity  SKIN: Warm and dry NEUROLOGIC:  Alert and oriented x 3 PSYCHIATRIC:  Normal affect   ASSESSMENT:    1. Coronary artery disease involving coronary bypass graft of native heart with angina pectoris ([place])   2. Essential hypertension, benign   3. Pure hypercholesterolemia     PLAN:    In order of problems listed above:  Coronary artery disease involving coronary bypass graft of native heart with angina pectoris ([place]) Overall stable without any anginal symptoms.  Continuing with aspirin losartan metoprolol Crestor Zetia.  Excellent goal-directed medical therapy.  On isosorbide.  Excellent.  Essential hypertension, benign No changes made  Pure hypercholesterolemia Prior visit changed him from atorvastatin over to Crestor 20.  GI has evaluated for elevated LFTs, 97 ALT at 1 point.  Doing very well with Zetia as well as Crestor 20 mg.  His LDL was 47.  Goal less than 50.  Wonderful.  No myalgias.  Follow-up: 1 year   Medication Adjustments/Labs and Tests Ordered: Current medicines are reviewed at length with the patient today.  Concerns regarding medicines are outlined above.   Orders Placed This Encounter  Procedures   EKG 12-Lead   Meds ordered this encounter  Medications   rosuvastatin (CRESTOR) 20 MG tablet    Sig: Take 1 tablet (20 mg total) by mouth daily.    Dispense:  90 tablet    Refill:  3   metoprolol succinate (TOPROL-XL) 50 MG 24 hr tablet    Sig: Take 1 tablet (50 mg total) by mouth daily. Take with or immediately following a meal.    Dispense:  90 tablet    Refill:  3   losartan  (COZAAR) 50 MG tablet    Sig: Take 1 tablet (50 mg total) by mouth daily.    Dispense:  90 tablet    Refill:  3   isosorbide mononitrate (IMDUR) 30 MG 24 hr tablet    Sig: Take 1 tablet (30 mg total) by mouth daily.  Dispense:  90 tablet    Refill:  3   ezetimibe (ZETIA) 10 MG tablet    Sig: Take 1 tablet (10 mg total) by mouth daily.    Dispense:  90 tablet    Refill:  3   There are no Patient Instructions on file for this visit.   I,Mathew Stumpf,acting as a Education administrator for [employer], MD.,have documented all relevant documentation on the behalf of Candee Furbish, MD,as directed by  Candee Furbish, MD while in the presence of Candee Furbish, MD.  I, Candee Furbish, MD, have reviewed all documentation for this visit. The documentation on 04/26/21 for the exam, diagnosis, procedures, and orders are all accurate and complete.   Signed, Candee Furbish, MD  04/26/2021 3:00 PM    [place]

## 2021-04-26 NOTE — Assessment & Plan Note (Signed)
No changes made

## 2021-04-30 ENCOUNTER — Ambulatory Visit: Payer: BC Managed Care – PPO | Admitting: Cardiology

## 2021-09-22 ENCOUNTER — Other Ambulatory Visit: Payer: Self-pay | Admitting: Cardiology

## 2022-01-07 DIAGNOSIS — Z86010 Personal history of colonic polyps: Secondary | ICD-10-CM | POA: Diagnosis not present

## 2022-01-07 DIAGNOSIS — Z0000 Encounter for general adult medical examination without abnormal findings: Secondary | ICD-10-CM | POA: Diagnosis not present

## 2022-01-07 DIAGNOSIS — E7800 Pure hypercholesterolemia, unspecified: Secondary | ICD-10-CM | POA: Diagnosis not present

## 2022-01-07 DIAGNOSIS — K7581 Nonalcoholic steatohepatitis (NASH): Secondary | ICD-10-CM | POA: Diagnosis not present

## 2022-01-07 DIAGNOSIS — I2510 Atherosclerotic heart disease of native coronary artery without angina pectoris: Secondary | ICD-10-CM | POA: Diagnosis not present

## 2022-01-07 DIAGNOSIS — I10 Essential (primary) hypertension: Secondary | ICD-10-CM | POA: Diagnosis not present

## 2022-01-07 DIAGNOSIS — R7303 Prediabetes: Secondary | ICD-10-CM | POA: Diagnosis not present

## 2022-03-28 ENCOUNTER — Encounter (HOSPITAL_BASED_OUTPATIENT_CLINIC_OR_DEPARTMENT_OTHER): Payer: Self-pay | Admitting: Cardiology

## 2022-03-28 MED ORDER — ROSUVASTATIN CALCIUM 20 MG PO TABS: 20.0000 mg | ORAL_TABLET | Freq: Every day | ORAL | 3 refills | Status: DC

## 2022-03-28 MED ORDER — EZETIMIBE 10 MG PO TABS: 10.0000 mg | ORAL_TABLET | Freq: Every day | ORAL | 3 refills | Status: DC

## 2022-04-13 ENCOUNTER — Encounter: Payer: Self-pay | Admitting: Cardiology

## 2022-04-17 ENCOUNTER — Ambulatory Visit (INDEPENDENT_AMBULATORY_CARE_PROVIDER_SITE_OTHER): Payer: Medicare HMO

## 2022-04-17 ENCOUNTER — Encounter: Payer: Self-pay | Admitting: Orthopedic Surgery

## 2022-04-17 ENCOUNTER — Ambulatory Visit: Payer: Medicare HMO | Admitting: Orthopedic Surgery

## 2022-04-17 ENCOUNTER — Ambulatory Visit: Payer: Self-pay

## 2022-04-17 DIAGNOSIS — M79601 Pain in right arm: Secondary | ICD-10-CM | POA: Diagnosis not present

## 2022-04-17 DIAGNOSIS — M19011 Primary osteoarthritis, right shoulder: Secondary | ICD-10-CM

## 2022-04-17 NOTE — Progress Notes (Signed)
Office Visit Note   Patient: Oscar Rodriguez           Date of Birth: Aug 18, 1955           MRN: [id number] Visit Date: 04/17/2022 Requested by: Gaynelle Arabian, MD [address] [place],  [place] [address] PCP: Gaynelle Arabian, MD  Subjective: Chief Complaint  Patient presents with   Right Shoulder - Pain    HPI: Oscar Rodriguez is a 67 y.o. male who presents to the office reporting right shoulder pain.  Reports 2 to 21-month history of right shoulder pain localizing to the superior aspect of the shoulder.  Is having some popping.  He is left-hand dominant.  Really enjoys working out about 3 times a week.  The pain does wake him from sleep at night but he avoids laying on that right-hand side.  Takes ibuprofen occasionally without much relief.  Denies any numbness or tingling or radicular pain but does have some episodic neck pain.  Has a history of left shoulder surgery..                ROS: All systems reviewed are negative as they relate to the chief complaint within the history of present illness.  Patient denies fevers or chills.  Assessment & Plan: Visit Diagnoses:  1. Right arm pain     Plan: Impression is right shoulder AC joint arthritis.  Present in both clinic and radiographically.  Rotator cuff strength looks good and passive motion is symmetric.  Plan at this time is right shoulder AC joint injection under ultrasound guidance.  Will see how he does with that injection.  Could consider repeat injection in 1 2 to 3 months if symptoms are improved.  I would try another injection per patient request if symptoms recur.  Discussed with patient typically we will try at least 1 if not 2 injections prior to surgical intervention.  Also talked with [name] about ways to work out his chest and shoulders without putting the [facility] joint under undue stress.  Follow-up as needed.  Follow-Up Instructions: No follow-ups on file.   Orders:  Orders Placed This Encounter  Procedures    XR Shoulder Right   XR Cervical Spine 2 or 3 views   No orders of the defined types were placed in this encounter.     Procedures: Medium Joint Inj: R acromioclavicular on 04/17/2022 10:44 PM Indications: pain and diagnostic evaluation Details: 27 G 1.5 in needle, superior approach Medications: 13.33 mg methylPREDNISolone acetate 40 MG/ML; 0.66 mL bupivacaine 0.25 %; 3 mL lidocaine 1 % Outcome: tolerated well, no immediate complications Procedure, treatment alternatives, risks and benefits explained, specific risks discussed. Consent was given by the patient. Immediately prior to procedure a time out was called to verify the correct patient, procedure, equipment, support staff and site/side marked as required. Patient was prepped and draped in the usual sterile fashion.    Ultrasound-guided no photos available   Clinical Data: No additional findings.  Objective: Vital Signs: There were no vitals taken for this visit.  Physical Exam:  Constitutional: Patient appears well-developed HEENT:  Head: Normocephalic Eyes:EOM are normal Neck: Normal range of motion Cardiovascular: Normal rate Pulmonary/chest: Effort normal Neurologic: Patient is alert Skin: Skin is warm Psychiatric: Patient has normal mood and affect  Ortho Exam: Ortho exam demonstrates full active and passive range of motion of the cervical spine.  Does have AC joint tenderness on the right not the left.  Pain with crossarm  adduction is present.  Motor or sensory function to the hand is intact.  Rotator cuff strength is excellent demonstrate supraspinatus and subscap muscle testing.  Negative O'Brien's testing positive speeds testing on the right.  No coarse grinding or crepitus at 90 degrees of abduction on the right-hand side.  Specialty Comments:  No specialty comments available.  Imaging: No results found.   PMFS History: Patient Active Problem List   Diagnosis Date Noted   Unstable angina ([place])     Coronary artery disease involving coronary bypass graft of native heart with angina pectoris ([place]) 07/16/2013   Obesity 07/16/2013   Pure hypercholesterolemia 07/16/2013   Essential hypertension, benign 07/16/2013   Past Medical History:  Diagnosis Date   Coronary artery disease    Hyperlipidemia    Hypertension     Family History  Problem Relation Age of Onset   Dementia Mother    Heart attack Maternal Uncle     Past Surgical History:  Procedure Laterality Date   CARDIAC SURGERY     by pass   LEFT HEART CATH AND CORS/GRAFTS ANGIOGRAPHY N/A 06/11/2017   Procedure: LEFT HEART CATH AND CORS/GRAFTS ANGIOGRAPHY;  Surgeon: Troy Sine, MD;  Location: [facility];  Service: Cardiovascular;  Laterality: N/A;   Social History   Occupational History   Not on file  Tobacco Use   Smoking status: Former    Types: Cigarettes    Start date: 07/16/1993    Quit date: 07/17/2003    Years since quitting: 18.7   Smokeless tobacco: Never  Vaping Use   Vaping Use: Never used  Substance and Sexual Activity   Alcohol use: Never   Drug use: Never   Sexual activity: Not on file

## 2022-04-18 MED ORDER — METHYLPREDNISOLONE ACETATE 40 MG/ML IJ SUSP
13.3300 mg | INTRAMUSCULAR | Status: AC | PRN
  Administered 2022-04-17: 13.33 mg via INTRA_ARTICULAR

## 2022-04-18 MED ORDER — BUPIVACAINE HCL 0.25 % IJ SOLN
0.6600 mL | INTRAMUSCULAR | Status: AC | PRN
  Administered 2022-04-17: .66 mL via INTRA_ARTICULAR

## 2022-04-18 MED ORDER — LIDOCAINE HCL 1 % IJ SOLN
3.0000 mL | INTRAMUSCULAR | Status: AC | PRN
  Administered 2022-04-17: 3 mL

## 2022-04-26 ENCOUNTER — Ambulatory Visit: Payer: Medicare HMO | Attending: Cardiology | Admitting: Cardiology

## 2022-04-26 ENCOUNTER — Encounter: Payer: Self-pay | Admitting: Cardiology

## 2022-04-26 VITALS — BP 112/68 | HR 57 | Ht 67.0 in | Wt 183.0 lb

## 2022-04-26 DIAGNOSIS — E7800 Pure hypercholesterolemia, unspecified: Secondary | ICD-10-CM | POA: Diagnosis not present

## 2022-04-26 DIAGNOSIS — I25709 Atherosclerosis of coronary artery bypass graft(s), unspecified, with unspecified angina pectoris: Secondary | ICD-10-CM | POA: Diagnosis not present

## 2022-04-26 DIAGNOSIS — I10 Essential (primary) hypertension: Secondary | ICD-10-CM | POA: Diagnosis not present

## 2022-04-26 NOTE — Patient Instructions (Signed)
Medication Instructions:  The current medical regimen is effective;  continue present plan and medications.  *If you need a refill on your cardiac medications before your next appointment, please call your pharmacy*  Follow-Up: At [facility], you and your health needs are our priority.  As part of our continuing mission to provide you with exceptional heart care, we have created designated Provider Care Teams.  These Care Teams include your primary Cardiologist (physician) and Advanced Practice Providers (APPs -  Physician Assistants and Nurse Practitioners) who all work together to provide you with the care you need, when you need it.  We recommend signing up for the patient portal called "MyChart".  Sign up information is provided on this After Visit Summary.  MyChart is used to connect with patients for Virtual Visits (Telemedicine).  Patients are able to view lab/test results, encounter notes, upcoming appointments, etc.  Non-urgent messages can be sent to your provider as well.   To learn more about what you can do with MyChart, go to [email or web address].    Your next appointment:   1 year(s)  Provider:   Mark Skains, MD      

## 2022-04-26 NOTE — Progress Notes (Signed)
Cardiology Office Note:    Date:  04/26/2022   ID:  [facility], [place] Dec 18, 1955, MRN [id number]  PCP:  Blair Heys, MD  [facility] Cardiologist:  Donato Schultz, MD  [facility] Electrophysiologist:  None   Referring MD: Blair Heys, MD    History of Present Illness:    Oscar Rodriguez is a 67 y.o. male here for the follow-up of coronary artery disease, hypertension, and hyperlipidemia.  Prior CABG 2012.  Left heart cath March 2019 showed patent LIMA with retrograde filling up to the point of proximal occlusion and collateralization with distal RCA noted and ostial occlusion of all 3 vein grafts.  Medical therapy.  Took isosorbide and felt better.  Had elevated LFTs followed by GI at one point.  [address], [address], [place].   Previously had complaints of exertional shortness of breath.  Previously not interested in sleep study.  His most recent lipids showed an LDL of 47.  Gym - doing well. No CP. 179 at home. 184 here. Gym is [employer], [employer]. Enjoy it. Down 30 pounds. Walk briskly.  Overall doing very well.  No chest pain.  Sometimes has some dizziness intermittently.  Not too bad he states.  I wonder if his blood pressures were getting too low now that he is lost some weight.   Past Medical History:  Diagnosis Date   Coronary artery disease    Hyperlipidemia    Hypertension     Past Surgical History:  Procedure Laterality Date   CARDIAC SURGERY     by pass   LEFT HEART CATH AND CORS/GRAFTS ANGIOGRAPHY N/A 06/11/2017   Procedure: LEFT HEART CATH AND CORS/GRAFTS ANGIOGRAPHY;  Surgeon: Lennette Bihari, MD;  Location: [facility];  Service: Cardiovascular;  Laterality: N/A;    Current Medications: Current Meds  Medication Sig   Ascorbic Acid (VITAMIN C) 1000 MG tablet Take 2,000 mg by mouth daily.   aspirin 81 MG tablet Take 81 mg by mouth daily.   cetirizine (ZYRTEC) 10 MG tablet Take 10 mg by mouth daily as needed for  allergies.   ezetimibe (ZETIA) 10 MG tablet Take 1 tablet (10 mg total) by mouth daily.   ibuprofen (ADVIL) 800 MG tablet Take 800 mg by mouth every 8 (eight) hours as needed for headache, mild pain or moderate pain.   isosorbide mononitrate (IMDUR) 30 MG 24 hr tablet TAKE 1 TABLET BY MOUTH EVERY DAY   losartan (COZAAR) 50 MG tablet Take 1 tablet (50 mg total) by mouth daily.   metoprolol succinate (TOPROL-XL) 50 MG 24 hr tablet Take 1 tablet (50 mg total) by mouth daily. Take with or immediately following a meal.   Multiple Vitamin (MULTIVITAMIN) tablet Take 1 tablet by mouth daily.   Omega-3 Fatty Acids (FISH OIL) 1200 MG CAPS Take 2,400 mg by mouth daily.    rosuvastatin (CRESTOR) 20 MG tablet Take 1 tablet (20 mg total) by mouth daily.   vitamin B-12 (CYANOCOBALAMIN) 100 MCG tablet Take 100 mcg by mouth daily.   vitamin E 180 MG (400 UNITS) capsule Take 400 Units by mouth daily.     Allergies:   Benadryl [diphenhydramine]   Social History   Socioeconomic History   Marital status: Single    Spouse name: Not on file   Number of children: Not on file   Years of education: Not on file   Highest education level: Not on file  Occupational History   Not on  file  Tobacco Use   Smoking status: Former    Types: Cigarettes    Start date: 07/16/1993    Quit date: 07/17/2003    Years since quitting: 18.7   Smokeless tobacco: Never  Vaping Use   Vaping Use: Never used  Substance and Sexual Activity   Alcohol use: Never   Drug use: Never   Sexual activity: Not on file  Other Topics Concern   Not on file  Social History Narrative   Not on file   Social Determinants of Health   Financial Resource Strain: Not on file  Food Insecurity: Not on file  Transportation Needs: Not on file  Physical Activity: Not on file  Stress: Not on file  Social Connections: Not on file     Family History: The patient's family history includes Dementia in his mother; Heart attack in his maternal  uncle.  ROS:   Please see the history of present illness.    (+) Shortness of breath (+) Chest pain All other systems reviewed and are negative.  EKGs/Labs/Other Studies Reviewed:    The following studies were reviewed today:  Diagnostic Left Heart CATH 05/2017: Prox RCA to Mid RCA lesion is 100% stenosed. Ost 2nd Mrg lesion is 100% stenosed. Ost LAD to Prox LAD lesion is 95% stenosed. Prox LAD lesion is 100% stenosed. Origin lesion is 100% stenosed. Origin lesion is 100% stenosed. Origin lesion is 100% stenosed. LV end diastolic pressure is mildly elevated. The left ventricular systolic function is normal. There is no mitral valve regurgitation.   Low normal global LV function with an ejection fraction estimate of 50% and mid-distal mild inferior hypocontractility.   Significant native CAD with evidence for coronary calcification involving all 3 native coronary arteries.   There was a 90% calcified very proximal LAD stenosis before the first diagonal vessel and the LAD was then occluded after this diagonal vessel.   The left circumflex coronary artery had total occlusion of its prominent marginal vessel, but there was evidence for bridging antegrade collaterals in a short segment after the occlusion as well as retrograde collaterals supplying this marginal vessel which bifurcated distally.   Total occlusion of the proximal RCA with bridging antegrade collaterals` and significant left to right distal collaterals from both the distal circumflex and LIMA to LAD.   Patent left internal mammary artery graft supplying the LAD which fills retrograde up to the point of proximal occlusion extends beyond the apex distally, which collateralizes the distal RCA.   Ostial occlusion of all 3 saphenous vein grafts  which had previously supplied the diagonal vessel, the marginal vessel, and the PDA branch of the RCA.   RECOMMENDATION: Recommend initial increased medical therapy trial with  addition of isosorbide mononitrate to his current regimen.  Consider future addition of amlodipine or possible ranolazine.  If continued symptoms persist, consider attempt at intervention to the circumflex marginal vessel.  Dominance: Right     Nuclear Stress Test 05/2017: Nuclear stress EF: 53%. Blood pressure demonstrated a hypotensive response to exercise. ST segment depression was noted during stress. Defect 1: There is a large defect of severe severity present in the basal inferior, basal inferolateral, mid inferior, mid inferolateral and apex location. This is a high risk study. Findings consistent with ischemia. The left ventricular ejection fraction is mildly decreased (45-54%).   Abnormal, high risk stress nuclear study with chest pain, possible hypotensive response, ECG changes and severe ischemia in the inferior and inferolateral walls; EF 53 with hypokinesis of  the inferior wall. Pt seen by Dr Anne Fu and scheduled for catheterization.  EKG:  EKG is personally reviewed and interpreted. 04/26/2022: Sinus bradycardia 57 no other abnormalities. 04/26/2021: Sinus rhythm. Rate 75 bpm. 12/28/2019: SR 73 no changes  Recent Labs: No results found for requested labs within last 365 days.   Recent Lipid Panel    Component Value Date/Time   CHOL 140 07/14/2020 0823   TRIG 89 07/14/2020 0823   HDL 55 07/14/2020 0823   CHOLHDL 2.5 07/14/2020 0823   CHOLHDL 3 07/05/2013 0835   VLDL 14.4 07/05/2013 0835   LDLCALC 68 07/14/2020 0823    Physical Exam:    VS:  BP 112/68   Pulse (!) 57   Ht 5\' 7"  (1.702 m)   Wt 183 lb (83 kg)   SpO2 98%   BMI 28.66 kg/m     Wt Readings from Last 3 Encounters:  04/26/22 183 lb (83 kg)  04/26/21 216 lb (98 kg)  12/28/19 212 lb (96.2 kg)     GEN:  Well nourished, well developed in no acute distress HEENT: Normal NECK: No JVD; No carotid bruits LYMPHATICS: No lymphadenopathy CARDIAC: RRR, no murmurs, rubs, gallops, scar RESPIRATORY:  Clear to  auscultation without rales, wheezing or rhonchi  ABDOMEN: Soft, non-tender, non-distended MUSCULOSKELETAL:  No edema; No deformity  SKIN: Warm and dry NEUROLOGIC:  Alert and oriented x 3 PSYCHIATRIC:  Normal affect   ASSESSMENT:    1. Coronary artery disease involving coronary bypass graft of native heart with angina pectoris (HCC)   2. Essential hypertension, benign   3. Pure hypercholesterolemia      PLAN:    In order of problems listed above:   Coronary artery disease involving coronary bypass graft of native heart with angina pectoris (HCC) Overall stable without any anginal symptoms.  Continuing with aspirin losartan metoprolol Crestor Zetia.  Excellent goal-directed medical therapy.  On isosorbide.  Excellent.  No changes made.  Essential hypertension, benign Blood pressures may be running a little bit low.  He may be symptomatic with this.  He will take his blood pressures and send 12/30/19 some values.  We may not need to change any medications but with his weight loss, we may need to pull back.  Pure hypercholesterolemia Prior visit changed him from atorvastatin over to Crestor 20.  GI has evaluated for elevated LFTs, 97 ALT at one point.  Doing very well with Zetia as well as Crestor 20 mg.  His LDL was 57.    Follow-up: 1 year   Medication Adjustments/Labs and Tests Ordered: Current medicines are reviewed at length with the patient today.  Concerns regarding medicines are outlined above.   Orders Placed This Encounter  Procedures   EKG 12-Lead   No orders of the defined types were placed in this encounter.  Patient Instructions  Medication Instructions:  The current medical regimen is effective;  continue present plan and medications.  *If you need a refill on your cardiac medications before your next appointment, please call your pharmacy*  Follow-Up: At [facility], you and your health needs are our priority.  As part of our continuing mission to  provide you with exceptional heart care, we have created designated Provider Care Teams.  These Care Teams include your primary Cardiologist (physician) and Advanced Practice Providers (APPs -  Physician Assistants and Nurse Practitioners) who all work together to provide you with the care you need, when you need it.  We recommend signing up for the  patient portal called "MyChart".  Sign up information is provided on this After Visit Summary.  MyChart is used to connect with patients for Virtual Visits (Telemedicine).  Patients are able to view lab/test results, encounter notes, upcoming appointments, etc.  Non-urgent messages can be sent to your provider as well.   To learn more about what you can do with MyChart, go to [email or web address].    Your next appointment:   1 year(s)  Provider:   Candee Furbish, MD         Signed, Candee Furbish, MD  04/26/2022 12:19 PM    [place]

## 2022-05-02 DIAGNOSIS — I2510 Atherosclerotic heart disease of native coronary artery without angina pectoris: Secondary | ICD-10-CM | POA: Diagnosis not present

## 2022-05-02 DIAGNOSIS — Z86010 Personal history of colonic polyps: Secondary | ICD-10-CM | POA: Diagnosis not present

## 2022-05-02 DIAGNOSIS — K7581 Nonalcoholic steatohepatitis (NASH): Secondary | ICD-10-CM | POA: Diagnosis not present

## 2022-05-16 DIAGNOSIS — Z86010 Personal history of colonic polyps: Secondary | ICD-10-CM | POA: Diagnosis not present

## 2022-05-16 DIAGNOSIS — D123 Benign neoplasm of transverse colon: Secondary | ICD-10-CM | POA: Diagnosis not present

## 2022-05-16 DIAGNOSIS — Z09 Encounter for follow-up examination after completed treatment for conditions other than malignant neoplasm: Secondary | ICD-10-CM | POA: Diagnosis not present

## 2022-05-16 DIAGNOSIS — D122 Benign neoplasm of ascending colon: Secondary | ICD-10-CM | POA: Diagnosis not present

## 2022-05-16 DIAGNOSIS — K648 Other hemorrhoids: Secondary | ICD-10-CM | POA: Diagnosis not present

## 2022-05-20 DIAGNOSIS — D123 Benign neoplasm of transverse colon: Secondary | ICD-10-CM | POA: Diagnosis not present

## 2022-05-20 DIAGNOSIS — D122 Benign neoplasm of ascending colon: Secondary | ICD-10-CM | POA: Diagnosis not present

## 2022-05-30 DIAGNOSIS — D122 Benign neoplasm of ascending colon: Secondary | ICD-10-CM | POA: Diagnosis not present

## 2022-05-30 DIAGNOSIS — D123 Benign neoplasm of transverse colon: Secondary | ICD-10-CM | POA: Diagnosis not present

## 2022-06-13 DIAGNOSIS — Z23 Encounter for immunization: Secondary | ICD-10-CM | POA: Diagnosis not present

## 2022-09-05 ENCOUNTER — Encounter (INDEPENDENT_AMBULATORY_CARE_PROVIDER_SITE_OTHER): Payer: Medicare HMO | Admitting: Cardiology

## 2022-09-05 DIAGNOSIS — I10 Essential (primary) hypertension: Secondary | ICD-10-CM | POA: Diagnosis not present

## 2022-09-10 NOTE — Telephone Encounter (Signed)

## 2022-12-10 DIAGNOSIS — H35033 Hypertensive retinopathy, bilateral: Secondary | ICD-10-CM | POA: Diagnosis not present

## 2023-01-13 DIAGNOSIS — I25709 Atherosclerosis of coronary artery bypass graft(s), unspecified, with unspecified angina pectoris: Secondary | ICD-10-CM | POA: Diagnosis not present

## 2023-01-13 DIAGNOSIS — Z0000 Encounter for general adult medical examination without abnormal findings: Secondary | ICD-10-CM | POA: Diagnosis not present

## 2023-01-13 DIAGNOSIS — E7800 Pure hypercholesterolemia, unspecified: Secondary | ICD-10-CM | POA: Diagnosis not present

## 2023-01-13 DIAGNOSIS — Z125 Encounter for screening for malignant neoplasm of prostate: Secondary | ICD-10-CM | POA: Diagnosis not present

## 2023-01-13 DIAGNOSIS — I2510 Atherosclerotic heart disease of native coronary artery without angina pectoris: Secondary | ICD-10-CM | POA: Diagnosis not present

## 2023-01-13 DIAGNOSIS — K7581 Nonalcoholic steatohepatitis (NASH): Secondary | ICD-10-CM | POA: Diagnosis not present

## 2023-01-13 DIAGNOSIS — Z136 Encounter for screening for cardiovascular disorders: Secondary | ICD-10-CM | POA: Diagnosis not present

## 2023-01-13 DIAGNOSIS — R7303 Prediabetes: Secondary | ICD-10-CM | POA: Diagnosis not present

## 2023-01-13 DIAGNOSIS — I10 Essential (primary) hypertension: Secondary | ICD-10-CM | POA: Diagnosis not present

## 2023-01-31 DIAGNOSIS — Z87891 Personal history of nicotine dependence: Secondary | ICD-10-CM | POA: Diagnosis not present

## 2023-01-31 DIAGNOSIS — D696 Thrombocytopenia, unspecified: Secondary | ICD-10-CM | POA: Diagnosis not present

## 2023-01-31 DIAGNOSIS — E875 Hyperkalemia: Secondary | ICD-10-CM | POA: Diagnosis not present

## 2023-01-31 DIAGNOSIS — Z136 Encounter for screening for cardiovascular disorders: Secondary | ICD-10-CM | POA: Diagnosis not present

## 2023-02-07 ENCOUNTER — Other Ambulatory Visit: Payer: Self-pay | Admitting: Family Medicine

## 2023-02-07 DIAGNOSIS — K7581 Nonalcoholic steatohepatitis (NASH): Secondary | ICD-10-CM

## 2023-02-11 DIAGNOSIS — K7581 Nonalcoholic steatohepatitis (NASH): Secondary | ICD-10-CM | POA: Diagnosis not present

## 2023-02-20 DIAGNOSIS — K7581 Nonalcoholic steatohepatitis (NASH): Secondary | ICD-10-CM | POA: Diagnosis not present

## 2023-02-20 DIAGNOSIS — Z23 Encounter for immunization: Secondary | ICD-10-CM | POA: Diagnosis not present

## 2023-02-21 ENCOUNTER — Ambulatory Visit
Admission: RE | Admit: 2023-02-21 | Discharge: 2023-02-21 | Disposition: A | Discharge status: Home / Self Care | Payer: Medicare HMO | Source: Ambulatory Visit | Attending: Family Medicine | Admitting: Family Medicine

## 2023-02-21 DIAGNOSIS — K7581 Nonalcoholic steatohepatitis (NASH): Secondary | ICD-10-CM

## 2023-03-24 DIAGNOSIS — Z23 Encounter for immunization: Secondary | ICD-10-CM | POA: Diagnosis not present

## 2023-04-01 ENCOUNTER — Other Ambulatory Visit (HOSPITAL_BASED_OUTPATIENT_CLINIC_OR_DEPARTMENT_OTHER): Payer: Self-pay | Admitting: Cardiology

## 2023-04-27 NOTE — Progress Notes (Unsigned)
Cardiology Office Note    Patient Name: Oscar Rodriguez Date of Encounter: 04/27/2023  Primary Care Provider:  Blair Heys, MD (Inactive) Primary Cardiologist:  Donato Schultz, MD Primary Electrophysiologist: None   Past Medical History    Past Medical History:  Diagnosis Date   Coronary artery disease    Hyperlipidemia    Hypertension     History of Present Illness  Oscar Rodriguez is a 68 y.o. male with a PMH of CAD s/p CABG x 4 07/2010, NAFLD, HLD, HTN who presents today for 1 year follow-up.  Oscar Rodriguez has been followed by Dr. Anne Fu since 2012 when he was seen for complaint of shortness of breath and underwent nuclear stress test that was abnormal.  He underwent subsequent LHC that showed three-vessel coronary disease.  He underwent CABG x 4 by Dr. Maren Beach in 07/2010.  He continued to endorse anginal symptoms and underwent repeat LHC on 05/2017 that showed patent LIMA filling retrograde with proximal occlusion and collateralization with distal RCA noted an ostial occlusion of all 3 vein grafts treated medically.  He has been followed by GI for his history of NAFLD and was last seen by Dr. Anne Fu on 04/26/2022.  He was screened for sleep apnea and advised to undergo sleep study however was not interested at that time.  During visit patient endorsed some dizziness intermittently that he felt may have been associated with low BP.  He had lost a significant amount of weight with his exercise regimen and felt that medications may need to be adjusted accordingly.  He was taken off losartan with improvement in symptoms.  Oscar Rodriguez presents for a routine annual follow-up.  His EKG performed during the visit showed new T wave inversions in leads III, AVF, and V2, suggestive of possible decreased blood flow in the heart. This finding was different from the patient's EKG from the previous year, which showed upright T waves in the same leads. He patient denied any new symptoms, including chest pain or  shortness of breath. He reported maintaining an active lifestyle, working out three times a week, which includes walking half a mile and weight lifting. The patient denied any changes in his exercise capacity or energy levels.  He is also currently managing a cold that has been lingering since after Christmas.  The patient also reported a history of dizziness, which was previously attributed to losartan. However, the losartan was discontinued, and the patient reported no recent episodes of dizziness. The patient's cholesterol and kidney function were reportedly well-managed on his current medication regimen.  Patient denies chest pain, palpitations, dyspnea, PND, orthopnea, nausea, vomiting, dizziness, syncope, edema, weight gain, or early satiety.  Review of Systems  Please see the history of present illness.    All other systems reviewed and are otherwise negative except as noted above.  Physical Exam    Wt Readings from Last 3 Encounters:  04/26/22 183 lb (83 kg)  04/26/21 216 lb (98 kg)  12/28/19 212 lb (96.2 kg)   UJ:WJXBJ were no vitals filed for this visit.,There is no height or weight on file to calculate BMI. GEN: Well nourished, well developed in no acute distress Neck: No JVD; No carotid bruits Pulmonary: Clear to auscultation without rales, wheezing or rhonchi  Cardiovascular: Normal rate. Regular rhythm. Normal S1. Normal S2.   Murmurs: There is no murmur.  ABDOMEN: Soft, non-tender, non-distended EXTREMITIES:  No edema; No deformity   EKG/LABS/ Recent Cardiac Studies   ECG personally reviewed by me  today -sinus rhythm with TWI in leads III, aVF and V2 with heart rate of 67 bpm.  Risk Assessment/Calculations:          Lab Results  Component Value Date   WBC 6.7 06/09/2017   HGB 16.6 06/09/2017   HCT 48.5 06/09/2017   MCV 95 06/09/2017   PLT 157 06/09/2017   Lab Results  Component Value Date   CREATININE 1.06 03/22/2019   BUN 13 03/22/2019   NA 140 03/22/2019    K 5.1 03/22/2019   CL 103 03/22/2019   CO2 20 03/22/2019   Lab Results  Component Value Date   CHOL 140 07/14/2020   HDL 55 07/14/2020   LDLCALC 68 07/14/2020   TRIG 89 07/14/2020   CHOLHDL 2.5 07/14/2020    Lab Results  Component Value Date   HGBA1C (H) 08/21/2010    5.8 (NOTE)                                                                       According to the ADA Clinical Practice Recommendations for 2011, when HbA1c is used as a screening test:   >=6.5%   Diagnostic of Diabetes Mellitus           (if abnormal result  is confirmed)  5.7-6.4%   Increased risk of developing Diabetes Mellitus  References:Diagnosis and Classification of Diabetes Mellitus,Diabetes Care,2011,34(Suppl 1):S62-S69 and Standards of Medical Care in         Diabetes - 2011,Diabetes Care,2011,34  (Suppl 1):S11-S61.   Assessment & Plan    1.  Coronary artery disease: -s/p CABG times 07/2010 with patent LIMA with proximal occlusion collateralized distal RCA treated medically -EKG today showed new inferior changes with patient reporting no decreased exercise tolerance or chest pain. -Patient is EKG was discussed with his primary cardiologist Dr. Anne Fu who recommended that we continue medical therapy at this time with no further evaluation needed -Continue current GDMT with ASA 81 mg, ezetimibe 10 mg, isosorbide 30 mg, Toprol-XL 50 mg, fish oil 2400 mg and Crestor 20 mg -We discussed the importance of increasing physical activity to at least 150 minutes of moderate aerobic exercise. -He was advised to contact us if he notices any new symptoms or changes consistent with his previous angina.  2.  Essential hypertension: -Blood pressure today is well-controlled at 110/82 -He is currently tolerating Toprol-XL 50 mg daily and denies any dizziness or fatigue.  3.  Hyperlipidemia: -Patient's last LDL was well-controlled at 53 -Continue Crestor 20 mg, fish oil 2400 mg, ezetimibe 10 mg -Increase physical activity as  tolerated  4.  NAFLD: -Patient currently followed by gastroenterology -Continue current treatment as advised  5.Upper Respiratory Symptoms Reported recent cold with nasal congestion. Currently taking Sudafed. -Advised to monitor blood pressure at home due to potential hypertensive effects of Sudafed. -Suggested trial of Flonase for symptom relief.  Disposition: Follow-up with Donato Schultz, MD or APP in 12 months    Signed, Napoleon Form, Leodis Rains, NP 04/27/2023, 12:10 PM [facility]

## 2023-04-28 ENCOUNTER — Encounter: Payer: Self-pay | Admitting: Nurse Practitioner

## 2023-04-28 ENCOUNTER — Encounter: Payer: Self-pay | Admitting: *Deleted

## 2023-04-28 ENCOUNTER — Other Ambulatory Visit: Payer: Self-pay | Admitting: *Deleted

## 2023-04-28 ENCOUNTER — Ambulatory Visit: Payer: Medicare HMO | Attending: Nurse Practitioner | Admitting: Nurse Practitioner

## 2023-04-28 VITALS — BP 110/82 | HR 67 | Ht 67.0 in | Wt 181.2 lb

## 2023-04-28 DIAGNOSIS — I10 Essential (primary) hypertension: Secondary | ICD-10-CM

## 2023-04-28 DIAGNOSIS — I25709 Atherosclerosis of coronary artery bypass graft(s), unspecified, with unspecified angina pectoris: Secondary | ICD-10-CM

## 2023-04-28 DIAGNOSIS — K760 Fatty (change of) liver, not elsewhere classified: Secondary | ICD-10-CM | POA: Diagnosis not present

## 2023-04-28 DIAGNOSIS — R0989 Other specified symptoms and signs involving the circulatory and respiratory systems: Secondary | ICD-10-CM

## 2023-04-28 DIAGNOSIS — E7800 Pure hypercholesterolemia, unspecified: Secondary | ICD-10-CM

## 2023-04-28 NOTE — Patient Instructions (Addendum)
Medication Instructions:    Your physician recommends that you continue on your current medications as directed. Please refer to the Current Medication list given to you today.   *If you need a refill on your cardiac medications before your next appointment, please call your pharmacy*   Lab Work:  NONE ORDERED  TODAY    If you have labs (blood work) drawn today and your tests are completely normal, you will receive your results only by: MyChart Message (if you have MyChart) OR A paper copy in the mail If you have any lab test that is abnormal or we need to change your treatment, we will call you to review the results.    Testing/Procedures: NONE ORDERED  TODAY    Follow-Up: At [facility], you and your health needs are our priority.  As part of our continuing mission to provide you with exceptional heart care, we have created designated Provider Care Teams.  These Care Teams include your primary Cardiologist (physician) and Advanced Practice Providers (APPs -  Physician Assistants and Nurse Practitioners) who all work together to provide you with the care you need, when you need it.  We recommend signing up for the patient portal called "MyChart".  Sign up information is provided on this After Visit Summary.  MyChart is used to connect with patients for Virtual Visits (Telemedicine).  Patients are able to view lab/test results, encounter notes, upcoming appointments, etc.  Non-urgent messages can be sent to your provider as well.   To learn more about what you can do with MyChart, go to [email or web address].     Your next appointment:    1 year(s)   Provider:    Donato Schultz, MD      Other Instructions ' MAKE SURE YOU CHECK YOUR BLOOD PRESSURE  WHILE  TAKING SUDAFED

## 2023-06-30 ENCOUNTER — Other Ambulatory Visit: Payer: Self-pay | Admitting: Nurse Practitioner

## 2023-06-30 DIAGNOSIS — K7469 Other cirrhosis of liver: Secondary | ICD-10-CM

## 2023-06-30 DIAGNOSIS — K7581 Nonalcoholic steatohepatitis (NASH): Secondary | ICD-10-CM

## 2023-10-08 DIAGNOSIS — R772 Abnormality of alphafetoprotein: Secondary | ICD-10-CM | POA: Diagnosis not present

## 2023-10-08 DIAGNOSIS — K7460 Unspecified cirrhosis of liver: Secondary | ICD-10-CM | POA: Diagnosis not present

## 2023-10-08 DIAGNOSIS — R188 Other ascites: Secondary | ICD-10-CM | POA: Diagnosis not present

## 2023-10-13 ENCOUNTER — Other Ambulatory Visit: Payer: Self-pay | Admitting: Internal Medicine

## 2023-10-13 DIAGNOSIS — K7460 Unspecified cirrhosis of liver: Secondary | ICD-10-CM

## 2023-10-13 DIAGNOSIS — R772 Abnormality of alphafetoprotein: Secondary | ICD-10-CM

## 2023-11-02 ENCOUNTER — Ambulatory Visit
Admission: RE | Admit: 2023-11-02 | Discharge: 2023-11-02 | Disposition: A | Discharge status: Home / Self Care | Source: Ambulatory Visit | Attending: Internal Medicine | Admitting: Internal Medicine

## 2023-11-02 DIAGNOSIS — R772 Abnormality of alphafetoprotein: Secondary | ICD-10-CM

## 2023-11-02 DIAGNOSIS — K7460 Unspecified cirrhosis of liver: Secondary | ICD-10-CM

## 2023-11-02 DIAGNOSIS — K766 Portal hypertension: Secondary | ICD-10-CM | POA: Diagnosis not present

## 2023-11-02 DIAGNOSIS — K8020 Calculus of gallbladder without cholecystitis without obstruction: Secondary | ICD-10-CM | POA: Diagnosis not present

## 2023-11-02 MED ORDER — GADOPICLENOL 0.5 MMOL/ML IV SOLN
8.0000 mL | Freq: Once | INTRAVENOUS | Status: AC | PRN
  Administered 2023-11-02: 8 mL via INTRAVENOUS

## 2023-12-11 DIAGNOSIS — H35033 Hypertensive retinopathy, bilateral: Secondary | ICD-10-CM | POA: Diagnosis not present

## 2023-12-31 ENCOUNTER — Other Ambulatory Visit: Payer: Self-pay | Admitting: Medical Genetics

## 2024-01-12 DIAGNOSIS — Z125 Encounter for screening for malignant neoplasm of prostate: Secondary | ICD-10-CM | POA: Diagnosis not present

## 2024-01-12 DIAGNOSIS — E7800 Pure hypercholesterolemia, unspecified: Secondary | ICD-10-CM | POA: Diagnosis not present

## 2024-01-12 DIAGNOSIS — R7303 Prediabetes: Secondary | ICD-10-CM | POA: Diagnosis not present

## 2024-01-12 DIAGNOSIS — I10 Essential (primary) hypertension: Secondary | ICD-10-CM | POA: Diagnosis not present

## 2024-01-14 DIAGNOSIS — K7581 Nonalcoholic steatohepatitis (NASH): Secondary | ICD-10-CM | POA: Diagnosis not present

## 2024-01-14 DIAGNOSIS — I2510 Atherosclerotic heart disease of native coronary artery without angina pectoris: Secondary | ICD-10-CM | POA: Diagnosis not present

## 2024-01-14 DIAGNOSIS — Z0000 Encounter for general adult medical examination without abnormal findings: Secondary | ICD-10-CM | POA: Diagnosis not present

## 2024-01-14 DIAGNOSIS — Z125 Encounter for screening for malignant neoplasm of prostate: Secondary | ICD-10-CM | POA: Diagnosis not present

## 2024-01-14 DIAGNOSIS — I10 Essential (primary) hypertension: Secondary | ICD-10-CM | POA: Diagnosis not present

## 2024-01-14 DIAGNOSIS — E7800 Pure hypercholesterolemia, unspecified: Secondary | ICD-10-CM | POA: Diagnosis not present

## 2024-01-14 DIAGNOSIS — K7460 Unspecified cirrhosis of liver: Secondary | ICD-10-CM | POA: Diagnosis not present

## 2024-01-14 DIAGNOSIS — I25709 Atherosclerosis of coronary artery bypass graft(s), unspecified, with unspecified angina pectoris: Secondary | ICD-10-CM | POA: Diagnosis not present

## 2024-01-14 DIAGNOSIS — R7303 Prediabetes: Secondary | ICD-10-CM | POA: Diagnosis not present

## 2024-03-12 ENCOUNTER — Encounter: Payer: Self-pay | Admitting: Cardiology

## 2024-03-21 ENCOUNTER — Other Ambulatory Visit (HOSPITAL_BASED_OUTPATIENT_CLINIC_OR_DEPARTMENT_OTHER): Payer: Self-pay | Admitting: Cardiology

## 2024-03-24 ENCOUNTER — Other Ambulatory Visit (HOSPITAL_COMMUNITY)
Admission: RE | Admit: 2024-03-24 | Discharge: 2024-03-24 | Disposition: A | Discharge status: Home / Self Care | Payer: Self-pay | Source: Ambulatory Visit | Attending: Medical Genetics | Admitting: Medical Genetics

## 2024-04-09 LAB — GENECONNECT MOLECULAR SCREEN: Genetic Analysis Overall Interpretation: NEGATIVE
